# Patient Record
Sex: Female | Born: 1970 | Race: White | Hispanic: No | Marital: Married | Smoking: Never smoker
Health system: Southern US, Community
[De-identification: ages and names within clinical notes are randomized; demographics above are authoritative.]

## PROBLEM LIST (undated history)

## (undated) DIAGNOSIS — F419 Anxiety disorder, unspecified: Secondary | ICD-10-CM

## (undated) DIAGNOSIS — K219 Gastro-esophageal reflux disease without esophagitis: Secondary | ICD-10-CM

## (undated) DIAGNOSIS — Z9889 Other specified postprocedural states: Secondary | ICD-10-CM

## (undated) DIAGNOSIS — IMO0002 Reserved for concepts with insufficient information to code with codable children: Secondary | ICD-10-CM

## (undated) DIAGNOSIS — E785 Hyperlipidemia, unspecified: Secondary | ICD-10-CM

## (undated) DIAGNOSIS — D649 Anemia, unspecified: Secondary | ICD-10-CM

## (undated) DIAGNOSIS — F32A Depression, unspecified: Secondary | ICD-10-CM

## (undated) DIAGNOSIS — J302 Other seasonal allergic rhinitis: Secondary | ICD-10-CM

## (undated) DIAGNOSIS — R112 Nausea with vomiting, unspecified: Secondary | ICD-10-CM

## (undated) HISTORY — PX: ABDOMINAL HYSTERECTOMY: SHX81

## (undated) HISTORY — PX: ROTATOR CUFF REPAIR: SHX139

## (undated) HISTORY — PX: TOTAL HIP ARTHROPLASTY: SHX124

---
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
## 1998-03-13 ENCOUNTER — Encounter: Admission: RE | Admit: 1998-03-13 | Discharge: 1998-03-13 | Payer: Self-pay | Admitting: Family Medicine

## 1998-04-12 ENCOUNTER — Encounter: Admission: RE | Admit: 1998-04-12 | Discharge: 1998-04-12 | Payer: Self-pay | Admitting: Family Medicine

## 2001-03-18 ENCOUNTER — Ambulatory Visit (HOSPITAL_COMMUNITY): Admission: RE | Admit: 2001-03-18 | Discharge: 2001-03-18 | Payer: Self-pay | Admitting: Family Medicine

## 2001-03-18 ENCOUNTER — Encounter: Payer: Self-pay | Admitting: Family Medicine

## 2004-05-08 ENCOUNTER — Other Ambulatory Visit: Admission: RE | Admit: 2004-05-08 | Discharge: 2004-05-08 | Payer: Self-pay | Admitting: Family Medicine

## 2005-06-15 ENCOUNTER — Ambulatory Visit (HOSPITAL_COMMUNITY): Admission: RE | Admit: 2005-06-15 | Discharge: 2005-06-15 | Payer: Self-pay | Admitting: Orthopedic Surgery

## 2006-02-19 ENCOUNTER — Emergency Department (HOSPITAL_COMMUNITY): Admission: EM | Admit: 2006-02-19 | Discharge: 2006-02-19 | Payer: Self-pay | Admitting: Family Medicine

## 2006-12-18 ENCOUNTER — Ambulatory Visit (HOSPITAL_COMMUNITY): Admission: RE | Admit: 2006-12-18 | Discharge: 2006-12-18 | Payer: Self-pay | Admitting: Ophthalmology

## 2006-12-18 ENCOUNTER — Encounter (INDEPENDENT_AMBULATORY_CARE_PROVIDER_SITE_OTHER): Payer: Self-pay | Admitting: Specialist

## 2009-08-02 ENCOUNTER — Observation Stay (HOSPITAL_COMMUNITY): Admission: AD | Admit: 2009-08-02 | Discharge: 2009-08-03 | Payer: Self-pay | Admitting: Obstetrics and Gynecology

## 2009-08-02 ENCOUNTER — Encounter: Payer: Self-pay | Admitting: Emergency Medicine

## 2009-10-02 ENCOUNTER — Inpatient Hospital Stay (HOSPITAL_COMMUNITY): Admission: AD | Admit: 2009-10-02 | Discharge: 2009-10-06 | Payer: Self-pay | Admitting: Obstetrics and Gynecology

## 2009-10-04 ENCOUNTER — Encounter (INDEPENDENT_AMBULATORY_CARE_PROVIDER_SITE_OTHER): Payer: Self-pay | Admitting: Obstetrics and Gynecology

## 2009-10-07 ENCOUNTER — Inpatient Hospital Stay (HOSPITAL_COMMUNITY): Admission: AD | Admit: 2009-10-07 | Discharge: 2009-10-07 | Payer: Self-pay | Admitting: Obstetrics and Gynecology

## 2009-10-19 ENCOUNTER — Inpatient Hospital Stay (HOSPITAL_COMMUNITY): Admission: AD | Admit: 2009-10-19 | Discharge: 2009-10-19 | Payer: Self-pay | Admitting: Obstetrics and Gynecology

## 2010-11-07 LAB — CBC
Hemoglobin: 10.6 g/dL — ABNORMAL LOW (ref 12.0–15.0)
Hemoglobin: 6.5 g/dL — CL (ref 12.0–15.0)
MCHC: 34.2 g/dL (ref 30.0–36.0)
MCV: 99.5 fL (ref 78.0–100.0)
Platelets: 198 10*3/uL (ref 150–400)
RBC: 1.88 MIL/uL — ABNORMAL LOW (ref 3.87–5.11)
RBC: 3.1 MIL/uL — ABNORMAL LOW (ref 3.87–5.11)
RDW: 12.9 % (ref 11.5–15.5)
RDW: 13.1 % (ref 11.5–15.5)
RDW: 13.3 % (ref 11.5–15.5)
WBC: 10.7 10*3/uL — ABNORMAL HIGH (ref 4.0–10.5)
WBC: 7.3 10*3/uL (ref 4.0–10.5)

## 2010-11-07 LAB — TYPE AND SCREEN
ABO/RH(D): O POS
Antibody Screen: NEGATIVE

## 2010-11-07 LAB — COMPREHENSIVE METABOLIC PANEL
Albumin: 3.1 g/dL — ABNORMAL LOW (ref 3.5–5.2)
BUN: 4 mg/dL — ABNORMAL LOW (ref 6–23)
Calcium: 8.4 mg/dL (ref 8.4–10.5)
Glucose, Bld: 99 mg/dL (ref 70–99)
Total Protein: 5.4 g/dL — ABNORMAL LOW (ref 6.0–8.3)

## 2010-11-07 LAB — ABO/RH: ABO/RH(D): O POS

## 2010-11-11 LAB — WOUND CULTURE

## 2010-11-19 LAB — CBC
HCT: 28.5 % — ABNORMAL LOW (ref 36.0–46.0)
HCT: 29.2 % — ABNORMAL LOW (ref 36.0–46.0)
Hemoglobin: 9.6 g/dL — ABNORMAL LOW (ref 12.0–15.0)
MCHC: 33.8 g/dL (ref 30.0–36.0)
MCV: 101.2 fL — ABNORMAL HIGH (ref 78.0–100.0)
Platelets: 192 10*3/uL (ref 150–400)
Platelets: 210 10*3/uL (ref 150–400)
RBC: 2.81 MIL/uL — ABNORMAL LOW (ref 3.87–5.11)
RBC: 2.91 MIL/uL — ABNORMAL LOW (ref 3.87–5.11)
RDW: 13.7 % (ref 11.5–15.5)
WBC: 5.8 10*3/uL (ref 4.0–10.5)
WBC: 5.9 10*3/uL (ref 4.0–10.5)

## 2010-11-20 LAB — URINALYSIS, ROUTINE W REFLEX MICROSCOPIC
Glucose, UA: NEGATIVE mg/dL
Hgb urine dipstick: NEGATIVE
Protein, ur: NEGATIVE mg/dL
Specific Gravity, Urine: 1.008 (ref 1.005–1.030)

## 2010-11-20 LAB — DIFFERENTIAL
Basophils Absolute: 0 10*3/uL (ref 0.0–0.1)
Eosinophils Relative: 3 % (ref 0–5)
Lymphocytes Relative: 12 % (ref 12–46)
Neutro Abs: 7 10*3/uL (ref 1.7–7.7)

## 2010-11-20 LAB — CBC
HCT: 30.3 % — ABNORMAL LOW (ref 36.0–46.0)
HCT: 37 % (ref 36.0–46.0)
Hemoglobin: 10.4 g/dL — ABNORMAL LOW (ref 12.0–15.0)
MCV: 99.1 fL (ref 78.0–100.0)
RBC: 3.74 MIL/uL — ABNORMAL LOW (ref 3.87–5.11)
RDW: 13.3 % (ref 11.5–15.5)
WBC: 8.9 10*3/uL (ref 4.0–10.5)

## 2010-11-20 LAB — D-DIMER, QUANTITATIVE

## 2010-11-20 LAB — CROSSMATCH: ABO/RH(D): O POS

## 2010-11-20 LAB — COMPREHENSIVE METABOLIC PANEL
CO2: 24 mEq/L (ref 19–32)
Calcium: 9 mg/dL (ref 8.4–10.5)
Creatinine, Ser: 0.7 mg/dL (ref 0.4–1.2)
GFR calc non Af Amer: >60 mL/min (ref >60)
Glucose, Bld: 105 mg/dL — ABNORMAL HIGH (ref 70–99)

## 2010-11-20 LAB — POCT CARDIAC MARKERS: Myoglobin, poc: 65.1 ng/mL (ref 12–200)

## 2010-11-20 LAB — HCG, QUANTITATIVE, PREGNANCY

## 2010-11-20 LAB — ABO/RH: ABO/RH(D): O POS

## 2011-01-04 NOTE — H&P (Signed)
[name], [name]           ACCOUNT NO.:  [id number]   MEDICAL RECORD NO.:  [id number]          PATIENT TYPE:  AMB   LOCATION:  SDC                           FACILITY:  [facility]   PHYSICIAN:  Duke Salvia. Marcelle Overlie, M.D.DATE OF BIRTH:  1970/11/11   DATE OF ADMISSION:  12/18/2006  DATE OF DISCHARGE:                              HISTORY & PHYSICAL   CHIEF COMPLAINT:  Menorrhagia.   HISTORY OF PRESENT ILLNESS:  A 40 year old G0, P0.  The patient has a 6-  12 month history of menorrhagia.  A recent Pap test showed ASCUS, with  negative high-risk HPV; SHG in our office showed a 4.9 cm subserosal  fibroid on the right lateral wall with some degeneration, and a 4 mm  polyp  noted on saline infusion.  This patient is gay and assures she  would not to be pregnant in the future.  She has opted for a D&C  hysteroscopy with ThermaChoice EMA.   This procedure, including the risks of bleeding, infection, adjacent  organ injury, the need for further surgery are reviewed with her; along  with her expected recovery time.  The rates of hypo- and amenorrhea post  ablation were reviewed with her; also which she understands and accepts.   PAST MEDICAL HISTORY:   ALLERGIES:  SULFA.   CURRENT MEDICATIONS:  Iron and vitamin supplement.   FAMILY HISTORY:  Significant for her father with hypothyroidism.  Her  father also has hypertension.   SOCIAL HISTORY:  She does smoke 1/2 - 1 pack per day.   PHYSICAL EXAMINATION:  VITAL SIGNS:  Temperature 98.2, blood pressure  110/68.  HEENT:  Unremarkable.  NECK:  Supple without masses.  LUNGS:  Clear.  CARDIOVASCULAR:  Regular rate and rhythm without murmurs, rubs or  gallops.  BREASTS:  Without masses.  ABDOMEN:  Soft, flat, nontender.  PELVIC:  Normal external genitalia.  Vagina and cervix clear.  Uterus  mid-positional size.  Adnexa negative.  EXTREMITIES:  Unremarkable.  NEUROLOGIC:  Unremarkable.   IMPRESSION:  1. Endometrial polyp.  2.  Menorrhagia with mild anemia.   PLAN:  D&C hysteroscopy, ThermaChoice EMA.  The procedures and risks  discussed, as above.  She understands that pregnancy would not be an  option for her post-ablation.  Other risks are discussed as above.      Richard M. Marcelle Overlie, M.D.  Electronically Signed     [facility]/MEDQ  D:  12/11/2006  T:  12/11/2006  Job:  [id number]

## 2011-01-04 NOTE — Op Note (Signed)
[name], [name]           ACCOUNT NO.:  [id number]   MEDICAL RECORD NO.:  [id number]          PATIENT TYPE:  AMB   LOCATION:  SDC                           FACILITY:  [facility]   PHYSICIAN:  Duke Salvia. Marcelle Sosa, M.D.DATE OF BIRTH:  [birth year]   DATE OF PROCEDURE:  12/18/2006  DATE OF DISCHARGE:                               OPERATIVE REPORT   PREOPERATIVE DIAGNOSES:  1. Menorrhagia.  2. Leiomyoma.   POSTOPERATIVE DIAGNOSES:  1. Menorrhagia.  2. Leiomyoma.   PROCEDURE:  Dilatation and curettage, hysteroscopy, with ThermaChoice  endometrial ablation.   SURGEON:  Duke Salvia. Marcelle Sosa, M.D.   ANESTHESIA:  General.   COMPLICATIONS:  None.   ESTIMATED BLOOD LOSS:  Minimal.   SPECIMENS REMOVED:  Endometrial curettings.   PROCEDURE AND FINDINGS:  The patient was taken to the operating room and  after an adequate level of general anesthesia was obtained with the  patient's legs in stirrups, the perineum and vagina were prepped and  draped with Betadine and the bladder was drained, EUA carried out.  Uterus upper limit of normal size, midposition, adnexa negative.  A  speculum was positioned.  Cervix grasped with tenaculum.  Paracervical  block created by infiltrating at 3 and 9 o'clock submucosally with 5-7  mL 1% Xylocaine on either side after negative aspiration.  The uterus  was sounded to 8 cm, was progressively dilated to a 27 Pratt dilator.  The 7-mm continuous flow hysteroscope was then used to perform  hysteroscopy, revealing a moderate amount of shaggy endometrium,  limiting the initial view.  Sharp curettage was carried out and a  moderate amount of tissue was sent to pathology.  The scope was  reinserted and the cavity was irrigated.  No other abnormalities  remained.  The ThermaChoice balloon was inserted per protocol and an 8-  minute procedure carried out per usual protocol with no immediate  complications.  She did receive preop Toradol along with preop  antibiotics.  She went to recovery room in good condition.      [name], M.D.  Electronically Signed    [facility]/MEDQ  D:  12/18/2006  T:  12/18/2006  Job:  [id number]

## 2011-11-25 ENCOUNTER — Other Ambulatory Visit: Payer: Self-pay | Admitting: Obstetrics and Gynecology

## 2012-03-17 ENCOUNTER — Encounter: Payer: Self-pay | Admitting: Gastroenterology

## 2012-03-19 ENCOUNTER — Other Ambulatory Visit: Payer: Self-pay | Admitting: Internal Medicine

## 2012-03-19 DIAGNOSIS — K219 Gastro-esophageal reflux disease without esophagitis: Secondary | ICD-10-CM

## 2012-03-20 ENCOUNTER — Ambulatory Visit
Admission: RE | Admit: 2012-03-20 | Discharge: 2012-03-20 | Disposition: A | Discharge status: Home / Self Care | Payer: BC Managed Care – PPO | Source: Ambulatory Visit | Attending: Internal Medicine | Admitting: Internal Medicine

## 2012-03-20 DIAGNOSIS — K219 Gastro-esophageal reflux disease without esophagitis: Secondary | ICD-10-CM

## 2012-03-21 ENCOUNTER — Emergency Department (HOSPITAL_COMMUNITY)
Admission: EM | Admit: 2012-03-21 | Discharge: 2012-03-21 | Disposition: A | Discharge status: Home / Self Care | Payer: BC Managed Care – PPO | Attending: Emergency Medicine | Admitting: Emergency Medicine

## 2012-03-21 ENCOUNTER — Encounter (HOSPITAL_COMMUNITY): Payer: Self-pay | Admitting: *Deleted

## 2012-03-21 ENCOUNTER — Emergency Department (HOSPITAL_COMMUNITY): Payer: BC Managed Care – PPO

## 2012-03-21 DIAGNOSIS — K6289 Other specified diseases of anus and rectum: Secondary | ICD-10-CM | POA: Insufficient documentation

## 2012-03-21 DIAGNOSIS — F411 Generalized anxiety disorder: Secondary | ICD-10-CM | POA: Insufficient documentation

## 2012-03-21 DIAGNOSIS — K5900 Constipation, unspecified: Secondary | ICD-10-CM

## 2012-03-21 HISTORY — DX: Reserved for concepts with insufficient information to code with codable children: IMO0002

## 2012-03-21 LAB — CBC WITH DIFFERENTIAL/PLATELET
Basophils Absolute: 0 10*3/uL (ref 0.0–0.1)
Basophils Relative: 0 % (ref 0–1)
Eosinophils Absolute: 0.3 10*3/uL (ref 0.0–0.7)
Eosinophils Relative: 6 % — ABNORMAL HIGH (ref 0–5)
HCT: 39 % (ref 36.0–46.0)
Lymphocytes Relative: 32 % (ref 12–46)
MCH: 32.3 pg (ref 26.0–34.0)
MCHC: 36.2 g/dL — ABNORMAL HIGH (ref 30.0–36.0)
MCV: 89.2 fL (ref 78.0–100.0)
Monocytes Absolute: 0.4 10*3/uL (ref 0.1–1.0)
Platelets: 263 10*3/uL (ref 150–400)
RDW: 12.1 % (ref 11.5–15.5)

## 2012-03-21 LAB — URINALYSIS, ROUTINE W REFLEX MICROSCOPIC
Glucose, UA: NEGATIVE mg/dL
Hgb urine dipstick: NEGATIVE
Ketones, ur: NEGATIVE mg/dL
Protein, ur: NEGATIVE mg/dL
Urobilinogen, UA: 0.2 mg/dL (ref 0.0–1.0)

## 2012-03-21 LAB — COMPREHENSIVE METABOLIC PANEL
AST: 29 U/L (ref 0–37)
Albumin: 4.4 g/dL (ref 3.5–5.2)
Alkaline Phosphatase: 53 U/L (ref 39–117)
BUN: 10 mg/dL (ref 6–23)
CO2: 27 mEq/L (ref 19–32)
Chloride: 99 mEq/L (ref 96–112)
GFR calc non Af Amer: >90 mL/min (ref >90)
Potassium: 4.3 mEq/L (ref 3.5–5.1)
Total Bilirubin: 0.5 mg/dL (ref 0.3–1.2)

## 2012-03-21 LAB — OCCULT BLOOD, POC DEVICE: Fecal Occult Bld: NEGATIVE

## 2012-03-21 MED ORDER — SODIUM CHLORIDE 0.9 % IV SOLN
1000.0000 mL | Freq: Once | INTRAVENOUS | Status: AC
  Administered 2012-03-21: 1000 mL via INTRAVENOUS

## 2012-03-21 MED ORDER — SODIUM CHLORIDE 0.9 % IV SOLN: 1000.0000 mL | INTRAVENOUS | Status: DC

## 2012-03-21 MED ORDER — PSYLLIUM 28 % PO PACK: 1.0000 | PACK | Freq: Two times a day (BID) | ORAL | Status: DC

## 2012-03-21 MED ORDER — ONDANSETRON HCL 4 MG/2ML IJ SOLN
4.0000 mg | Freq: Once | INTRAMUSCULAR | Status: AC
  Administered 2012-03-21: 4 mg via INTRAVENOUS
  Filled 2012-03-21: qty 2

## 2012-03-21 MED ORDER — IOHEXOL 300 MG/ML  SOLN
100.0000 mL | Freq: Once | INTRAMUSCULAR | Status: AC | PRN
  Administered 2012-03-21: 100 mL via INTRAVENOUS

## 2012-03-21 MED ORDER — DOCUSATE SODIUM 100 MG PO CAPS: 100.0000 mg | ORAL_CAPSULE | Freq: Two times a day (BID) | ORAL | Status: AC

## 2012-03-21 MED ORDER — ESOMEPRAZOLE MAGNESIUM 40 MG PO CPDR: 40.0000 mg | DELAYED_RELEASE_CAPSULE | Freq: Every day | ORAL | Status: DC

## 2012-03-21 MED ORDER — PROMETHAZINE HCL 25 MG RE SUPP: 25.0000 mg | Freq: Four times a day (QID) | RECTAL | Status: DC | PRN

## 2012-03-21 MED ORDER — HYDROMORPHONE HCL PF 1 MG/ML IJ SOLN
1.0000 mg | Freq: Once | INTRAMUSCULAR | Status: AC
  Administered 2012-03-21: 1 mg via INTRAVENOUS
  Filled 2012-03-21: qty 1

## 2012-03-21 NOTE — ED Provider Notes (Addendum)
History     CSN: [id number]  Arrival date & time 03/21/12  1235   First MD Initiated Contact with Patient 03/21/12 1321      Chief Complaint  Patient presents with  . Rectal Pain     HPI Pt is having rectal pain ongoing for the last day.  She has been having trouble with pelvic pain and constipation for several months. SHe Pt has been taking miralax without relief.  Pt called her doctor this week and was referred to a GI doctor. Pt had a gallbladder ultrasound this past week that was unremarkable.  Pt feels a lot of pressure in the rectum and feels like she is obstructed.  Her bowel movements are typically very small in caliber, and hard.  She has to strain very heard.  Her last BM was this am after an enema (watery).  She has been passing hard stools.  No fevers.  No weakness in legs. She has chronic back pain but this is no worse than usual.   Some dizziness.  She feels like something is narrowed. Pt feels pressure in the pelvic region.  Pt is frustrated because she does not feel like her doctor is paying attention to her.  Pt is a [employer].  She has had a pelvic exam as part of her prior outpt eval as well as having her thyroid hormone checked.   Past Medical History  Diagnosis Date  . Degenerated intervertebral disc     Past Surgical History  Procedure Date  . Abdominal hysterectomy   including ovaries  No family history on file.  History  Substance Use Topics  . Smoking status: Never Smoker   . Smokeless tobacco: Not on file  . Alcohol Use: Yes    OB History    Grav Para Term Preterm Abortions TAB SAB Ect Mult Living                  Review of Systems  Constitutional: Negative for fever.  Gastrointestinal: Negative for vomiting and blood in stool.  Genitourinary: Negative for dysuria.  All other systems reviewed and are negative.    Allergies  Sulfa antibiotics  Home Medications   Current Outpatient Rx  Name Route Sig Dispense Refill  . CETIRIZINE HCL 10 MG PO  TABS Oral Take 10 mg by mouth daily.    Marland Kitchen VITAMIN D 1000 UNITS PO TABS Oral Take 2,000 Units by mouth daily.    Marland Kitchen ESTRADIOL 0.0375 MG/24HR TD PTTW Transdermal Place 1 patch onto the skin 2 (two) times a week. Mondays and thursdays.    . OMEGA-3 FATTY ACIDS 1000 MG PO CAPS Oral Take 2 g by mouth daily.    . IBUPROFEN 200 MG PO TABS Oral Take 600 mg by mouth every 8 (eight) hours as needed. For pain.    . ADULT MULTIVITAMIN W/MINERALS CH Oral Take 1 tablet by mouth daily.    . TRAMADOL HCL 50 MG PO TABS Oral Take 50 mg by mouth at bedtime.      BP 124/92  Pulse 62  Temp 98.1 F (36.7 C) (Oral)  Resp 18  SpO2 100%  Physical Exam  Nursing note and vitals reviewed. Constitutional: She appears well-developed and well-nourished.       Tearful, anxious  HENT:  Head: Normocephalic and atraumatic.  Right Ear: External ear normal.  Left Ear: External ear normal.  Eyes: Conjunctivae are normal. Right eye exhibits no discharge. Left eye exhibits no discharge. No scleral icterus.  Neck: Neck supple. No tracheal deviation present.  Cardiovascular: Normal rate, regular rhythm and intact distal pulses.   Pulmonary/Chest: Effort normal and breath sounds normal. No stridor. No respiratory distress. She has no wheezes. She has no rales.  Abdominal: Soft. Bowel sounds are normal. She exhibits no distension. There is no tenderness. There is no rebound and no guarding.  Genitourinary: Rectal exam shows no mass and anal tone normal.       No fecal impaction, rectal vault empty  Musculoskeletal: She exhibits no edema and no tenderness.  Neurological: She is alert. She has normal strength. No sensory deficit. Cranial nerve deficit:  no gross defecits noted. She exhibits normal muscle tone. She displays no seizure activity. Coordination normal.  Skin: Skin is warm and dry. No rash noted. She is not diaphoretic.  Psychiatric: She has a normal mood and affect.    ED Course  Procedures (including critical  care time)  Labs Reviewed  CBC WITH DIFFERENTIAL - Abnormal; Notable for the following:    MCHC 36.2 (*)     Eosinophils Relative 6 (*)     All other components within normal limits  OCCULT BLOOD, POC DEVICE  COMPREHENSIVE METABOLIC PANEL  URINALYSIS, ROUTINE W REFLEX MICROSCOPIC   Ct Abdomen Pelvis W Contrast  03/21/2012  *RADIOLOGY REPORT*  Clinical Data: Rectal pain  CT ABDOMEN AND PELVIS WITH CONTRAST  Technique:  Multidetector CT imaging of the abdomen and pelvis was performed following the standard protocol during bolus administration of intravenous contrast.  Contrast: OMNIPAQUE IOHEXOL 300 MG/ML  SOLN  Comparison: 09/22/2009  Findings: The lung bases appear clear.  No pericardial or pleural effusion noted.  Enhancing structure within the right hepatic lobe is unchanged from previous exam dating back to 08/02/2009.  Likely benign.  The gallbladder appears normal.  No biliary dilatation.  The pancreas appears within normal limits.  Normal appearance of the spleen.  Both adrenal glands are normal.  The right kidney is normal.  The left kidney is normal.  Urinary bladder is unremarkable.  Previous hysterectomy.  No enlarged lymph nodes within the upper abdomen.  There is no pelvic or inguinal adenopathy identified.  No free fluid or fluid collections within the abdomen or the pelvis.  Normal appearance of the stomach.  The small bowel loops are unremarkable.  The appendix is identified.  There is gas noted within the lumen of the appendix consistent with a luminal patency. No periappendiceal fat stranding or free fluid noted.  A moderate amount of stool is identified throughout the colon.  No obstructing colonic lesion identified.  IMPRESSION:  1.  No acute findings. 2.  Moderate stool burden identified within the colon.  Correlate for any clinical signs or symptoms of constipation.  Original Report Authenticated By: Rosealee Albee, M.D.   US Abdomen Limited Ruq  03/20/2012  *RADIOLOGY  REPORT*  Clinical Data: Gastroesophageal reflux disease.  Abdominal pain and nausea  LIMITED ABDOMINAL ULTRASOUND  Comparison:  CT 10/02/2009  Findings: The gallbladder appears well distended.  No evidence for intraluminal stones or sludge are noted.  No pericholecystic fluid or gallbladder wall thickening is identified. Evaluation for a sonographic Murphy's sign is negative.  The common bile duct measures 2.1 mm in diameter and has a normal appearance.  The liver parenchyma appears homogeneous in echotexture with no evidence for focal parenchymal abnormality or signs of intrahepatic ductal dilatation.  The 8 mm lesion seen on prior CT in the anterior right lobe of the liver was  not visualized on today's ultrasound and if evaluation for stability of this lesion is desired, repeat abdominal CT would be necessary.  No right upper quadrant fluid is seen  IMPRESSION: No focal abnormality identified  Original Report Authenticated By: Bertha Stakes, M.D.      MDM  Pt is concerned about the severity of her symptoms and progression.  She has an appt set up with a GI doctor but it is later this month.  Will ct to evaluate for possible obstructing mass causing her symptoms.    Celene Kras, MD 03/21/12 1445  Ct scan without mass or other acute pathology.  Will treat her constipation.  Follow up with GI and PCP as planned  Celene Kras, MD 03/21/12 1538

## 2012-03-21 NOTE — ED Notes (Signed)
Pt states she is having rectal pain. Pt state her pcp is not addressing the her pain. Pt states she feel as if she is constipated. Pt states she gave her self an enema and small amount of stool came out. Pt states she feels pressure from her rectal area. No bleeding noted

## 2012-04-14 ENCOUNTER — Ambulatory Visit: Payer: Self-pay | Admitting: Gastroenterology

## 2012-11-26 ENCOUNTER — Other Ambulatory Visit: Payer: Self-pay | Admitting: Obstetrics and Gynecology

## 2013-01-04 ENCOUNTER — Encounter (HOSPITAL_COMMUNITY): Payer: Self-pay | Admitting: Certified Registered Nurse Anesthetist

## 2013-01-04 ENCOUNTER — Observation Stay (HOSPITAL_COMMUNITY)
Admission: EM | Admit: 2013-01-04 | Discharge: 2013-01-05 | Disposition: A | Discharge status: Home / Self Care | Payer: BC Managed Care – PPO | Attending: Surgery | Admitting: Surgery

## 2013-01-04 ENCOUNTER — Other Ambulatory Visit (HOSPITAL_COMMUNITY): Payer: Self-pay | Admitting: Internal Medicine

## 2013-01-04 ENCOUNTER — Encounter (HOSPITAL_COMMUNITY): Payer: Self-pay

## 2013-01-04 ENCOUNTER — Encounter (HOSPITAL_COMMUNITY)
Admission: EM | Disposition: A | Discharge status: Home / Self Care | Payer: Self-pay | Source: Home / Self Care | Attending: Emergency Medicine

## 2013-01-04 ENCOUNTER — Encounter (HOSPITAL_COMMUNITY): Payer: Self-pay | Admitting: Emergency Medicine

## 2013-01-04 ENCOUNTER — Ambulatory Visit (HOSPITAL_COMMUNITY)
Admission: RE | Admit: 2013-01-04 | Discharge: 2013-01-04 | Disposition: A | Discharge status: Home / Self Care | Payer: BC Managed Care – PPO | Source: Ambulatory Visit | Attending: Internal Medicine | Admitting: Internal Medicine

## 2013-01-04 ENCOUNTER — Emergency Department (HOSPITAL_COMMUNITY): Payer: BC Managed Care – PPO | Admitting: Certified Registered Nurse Anesthetist

## 2013-01-04 DIAGNOSIS — R109 Unspecified abdominal pain: Secondary | ICD-10-CM

## 2013-01-04 DIAGNOSIS — K3580 Unspecified acute appendicitis: Secondary | ICD-10-CM | POA: Insufficient documentation

## 2013-01-04 DIAGNOSIS — D1809 Hemangioma of other sites: Secondary | ICD-10-CM | POA: Insufficient documentation

## 2013-01-04 DIAGNOSIS — M47817 Spondylosis without myelopathy or radiculopathy, lumbosacral region: Secondary | ICD-10-CM | POA: Insufficient documentation

## 2013-01-04 DIAGNOSIS — R1031 Right lower quadrant pain: Secondary | ICD-10-CM | POA: Insufficient documentation

## 2013-01-04 DIAGNOSIS — K5900 Constipation, unspecified: Secondary | ICD-10-CM | POA: Insufficient documentation

## 2013-01-04 DIAGNOSIS — Z90710 Acquired absence of both cervix and uterus: Secondary | ICD-10-CM | POA: Insufficient documentation

## 2013-01-04 DIAGNOSIS — K352 Acute appendicitis with generalized peritonitis, without abscess: Secondary | ICD-10-CM | POA: Diagnosis present

## 2013-01-04 DIAGNOSIS — R110 Nausea: Secondary | ICD-10-CM | POA: Insufficient documentation

## 2013-01-04 DIAGNOSIS — K35209 Acute appendicitis with generalized peritonitis, without abscess, unspecified as to perforation: Principal | ICD-10-CM | POA: Diagnosis present

## 2013-01-04 DIAGNOSIS — Z9049 Acquired absence of other specified parts of digestive tract: Secondary | ICD-10-CM

## 2013-01-04 DIAGNOSIS — K769 Liver disease, unspecified: Secondary | ICD-10-CM | POA: Insufficient documentation

## 2013-01-04 HISTORY — PX: LAPAROSCOPIC APPENDECTOMY: SHX408

## 2013-01-04 LAB — URINALYSIS, ROUTINE W REFLEX MICROSCOPIC
Bilirubin Urine: NEGATIVE
Glucose, UA: NEGATIVE mg/dL
Hgb urine dipstick: NEGATIVE
Ketones, ur: NEGATIVE mg/dL
Leukocytes, UA: NEGATIVE
Nitrite: NEGATIVE
Protein, ur: NEGATIVE mg/dL
Specific Gravity, Urine: 1.028 (ref 1.005–1.030)
Urobilinogen, UA: 0.2 mg/dL (ref 0.0–1.0)
pH: 6 (ref 5.0–8.0)

## 2013-01-04 LAB — BASIC METABOLIC PANEL
BUN: 7 mg/dL (ref 6–23)
CO2: 27 mEq/L (ref 19–32)
Calcium: 9.1 mg/dL (ref 8.4–10.5)
Chloride: 100 mEq/L (ref 96–112)
Creatinine, Ser: 0.71 mg/dL (ref 0.50–1.10)
GFR calc Af Amer: >90 mL/min (ref >90)
GFR calc non Af Amer: >90 mL/min (ref >90)
Glucose, Bld: 89 mg/dL (ref 70–99)
Potassium: 4.6 mEq/L (ref 3.5–5.1)
Sodium: 137 mEq/L (ref 135–145)

## 2013-01-04 LAB — CBC WITH DIFFERENTIAL/PLATELET
Basophils Absolute: 0 10*3/uL (ref 0.0–0.1)
Basophils Relative: 1 % (ref 0–1)
Eosinophils Absolute: 0.4 10*3/uL (ref 0.0–0.7)
Eosinophils Relative: 7 % — ABNORMAL HIGH (ref 0–5)
HCT: 35 % — ABNORMAL LOW (ref 36.0–46.0)
Hemoglobin: 11.8 g/dL — ABNORMAL LOW (ref 12.0–15.0)
Lymphocytes Relative: 37 % (ref 12–46)
Lymphs Abs: 2 10*3/uL (ref 0.7–4.0)
MCH: 31.6 pg (ref 26.0–34.0)
MCHC: 33.7 g/dL (ref 30.0–36.0)
MCV: 93.6 fL (ref 78.0–100.0)
Monocytes Absolute: 0.5 10*3/uL (ref 0.1–1.0)
Monocytes Relative: 10 % (ref 3–12)
Neutro Abs: 2.4 10*3/uL (ref 1.7–7.7)
Neutrophils Relative %: 45 % (ref 43–77)
Platelets: 310 10*3/uL (ref 150–400)
RBC: 3.74 MIL/uL — ABNORMAL LOW (ref 3.87–5.11)
RDW: 13.5 % (ref 11.5–15.5)
WBC: 5.3 10*3/uL (ref 4.0–10.5)

## 2013-01-04 SURGERY — APPENDECTOMY, LAPAROSCOPIC
Anesthesia: General | Site: Abdomen | Wound class: Dirty or Infected

## 2013-01-04 MED ORDER — IOHEXOL 300 MG/ML  SOLN
50.0000 mL | Freq: Once | INTRAMUSCULAR | Status: AC | PRN
  Administered 2013-01-04: 50 mL via ORAL

## 2013-01-04 MED ORDER — LACTATED RINGERS IR SOLN
Status: DC | PRN
  Administered 2013-01-04: 1000 mL

## 2013-01-04 MED ORDER — SCOPOLAMINE 1 MG/3DAYS TD PT72
MEDICATED_PATCH | TRANSDERMAL | Status: AC
  Filled 2013-01-04: qty 1

## 2013-01-04 MED ORDER — ACETAMINOPHEN 10 MG/ML IV SOLN
INTRAVENOUS | Status: AC
  Filled 2013-01-04: qty 100

## 2013-01-04 MED ORDER — CEFOXITIN SODIUM-DEXTROSE 1-4 GM-% IV SOLR (PREMIX)
INTRAVENOUS | Status: AC
  Filled 2013-01-04: qty 50

## 2013-01-04 MED ORDER — BUPIVACAINE HCL (PF) 0.25 % IJ SOLN
INTRAMUSCULAR | Status: AC
  Filled 2013-01-04: qty 30

## 2013-01-04 MED ORDER — 0.9 % SODIUM CHLORIDE (POUR BTL) OPTIME
TOPICAL | Status: DC | PRN
  Administered 2013-01-04: 1000 mL

## 2013-01-04 MED ORDER — IOHEXOL 300 MG/ML  SOLN
100.0000 mL | Freq: Once | INTRAMUSCULAR | Status: AC | PRN
  Administered 2013-01-04: 100 mL via INTRAVENOUS

## 2013-01-04 SURGICAL SUPPLY — 46 items
ADH SKN CLS APL DERMABOND .7 (GAUZE/BANDAGES/DRESSINGS) ×1
APL SKNCLS STERI-STRIP NONHPOA (GAUZE/BANDAGES/DRESSINGS)
APPLIER CLIP ROT 10 11.4 M/L (STAPLE)
APR CLP MED LRG 11.4X10 (STAPLE)
BAG SPEC RTRVL LRG 6X4 10 (ENDOMECHANICALS) ×1
BENZOIN TINCTURE PRP APPL 2/3 (GAUZE/BANDAGES/DRESSINGS) ×1 IMPLANT
CANISTER SUCTION 2500CC (MISCELLANEOUS) ×2 IMPLANT
CLIP APPLIE ROT 10 11.4 M/L (STAPLE) IMPLANT
CLOTH BEACON ORANGE TIMEOUT ST (SAFETY) ×2 IMPLANT
CUTTER FLEX LINEAR 45M (STAPLE) ×1 IMPLANT
DECANTER SPIKE VIAL GLASS SM (MISCELLANEOUS) ×1 IMPLANT
DERMABOND ADVANCED (GAUZE/BANDAGES/DRESSINGS) ×1
DERMABOND ADVANCED .7 DNX12 (GAUZE/BANDAGES/DRESSINGS) IMPLANT
DEVICE TROCAR PUNCTURE CLOSURE (ENDOMECHANICALS) ×1 IMPLANT
DRAPE LAPAROSCOPIC ABDOMINAL (DRAPES) ×2 IMPLANT
ELECT REM PT RETURN 9FT ADLT (ELECTROSURGICAL) ×2
ELECTRODE REM PT RTRN 9FT ADLT (ELECTROSURGICAL) ×1 IMPLANT
ENDOLOOP SUT PDS II  0 18 (SUTURE)
ENDOLOOP SUT PDS II 0 18 (SUTURE) IMPLANT
GLOVE BIOGEL PI IND STRL 7.0 (GLOVE) ×1 IMPLANT
GLOVE BIOGEL PI INDICATOR 7.0 (GLOVE) ×1
GLOVE SURG SIGNA 7.5 PF LTX (GLOVE) ×2 IMPLANT
GOWN STRL NON-REIN LRG LVL3 (GOWN DISPOSABLE) ×2 IMPLANT
GOWN STRL REIN XL XLG (GOWN DISPOSABLE) ×4 IMPLANT
KIT BASIN OR (CUSTOM PROCEDURE TRAY) ×2 IMPLANT
PENCIL BUTTON HOLSTER BLD 10FT (ELECTRODE) IMPLANT
POUCH SPECIMEN RETRIEVAL 10MM (ENDOMECHANICALS) ×2 IMPLANT
RELOAD 45 VASCULAR/THIN (ENDOMECHANICALS) IMPLANT
RELOAD STAPLE 45 2.5 WHT GRN (ENDOMECHANICALS) IMPLANT
RELOAD STAPLE 45 3.5 BLU ETS (ENDOMECHANICALS) IMPLANT
RELOAD STAPLE TA45 3.5 REG BLU (ENDOMECHANICALS) ×2 IMPLANT
SCALPEL HARMONIC ACE (MISCELLANEOUS) ×2 IMPLANT
SET IRRIG TUBING LAPAROSCOPIC (IRRIGATION / IRRIGATOR) ×2 IMPLANT
SOLUTION ANTI FOG 6CC (MISCELLANEOUS) ×2 IMPLANT
STRIP CLOSURE SKIN 1/4X3 (GAUZE/BANDAGES/DRESSINGS) ×1 IMPLANT
SUT MON AB 5-0 PS2 18 (SUTURE) ×2 IMPLANT
SUT VICRYL 0 UR6 27IN ABS (SUTURE) ×2 IMPLANT
TOWEL OR 17X26 10 PK STRL BLUE (TOWEL DISPOSABLE) ×2 IMPLANT
TRAY FOLEY CATH 14FRSI W/METER (CATHETERS) ×2 IMPLANT
TRAY LAP CHOLE (CUSTOM PROCEDURE TRAY) ×2 IMPLANT
TROCAR BLADELESS OPT 5 100 (ENDOMECHANICALS) ×1 IMPLANT
TROCAR XCEL 12X100 BLDLESS (ENDOMECHANICALS) ×1 IMPLANT
TROCAR XCEL BLUNT TIP 100MML (ENDOMECHANICALS) ×2 IMPLANT
TROCAR Z-THREAD FIOS 11X100 BL (TROCAR) ×2 IMPLANT
TROCAR Z-THREAD FIOS 5X100MM (TROCAR) ×2 IMPLANT
TUBING INSUFFLATION 10FT LAP (TUBING) ×2 IMPLANT

## 2013-01-04 NOTE — ED Notes (Signed)
Pt to OR.

## 2013-01-04 NOTE — ED Notes (Signed)
MD at bedside. 

## 2013-01-04 NOTE — ED Provider Notes (Signed)
History     CSN: [id number]  Arrival date & time 01/04/13  2142   First MD Initiated Contact with Patient 01/04/13 2150      Chief Complaint  Patient presents with  . Abdominal Pain  . appendicitis     (Consider location/radiation/quality/duration/timing/severity/associated sxs/prior treatment) HPI Patient presents to the emergency department with a four-day history of right lower quadrant abdominal pain.  Patient, states she was seen by her primary Dr. who sent her here for CT scan as an outpatient CT scan was done, which showed appendicitis.  Patient, states she's had some nausea, but no vomiting, chills, but no documented fever.  Patient, states, that she's not had any dizziness, weakness, vomiting, headache, blurred vision, syncope, or fever.  Patient, states, that she did not take anything prior to arrival for her symptoms Past Medical History  Diagnosis Date  . Degenerated intervertebral disc     Past Surgical History  Procedure Laterality Date  . Abdominal hysterectomy      No family history on file.  History  Substance Use Topics  . Smoking status: Never Smoker   . Smokeless tobacco: Not on file  . Alcohol Use: Yes    OB History   Grav Para Term Preterm Abortions TAB SAB Ect Mult Living                  Review of Systems All other systems negative except as documented in the HPI. All pertinent positives and negatives as reviewed in the HPI. Allergies  Sulfa antibiotics  Home Medications   Current Outpatient Rx  Name  Route  Sig  Dispense  Refill  . calcium-vitamin D (OSCAL WITH D) 500-200 MG-UNIT per tablet   Oral   Take 1 tablet by mouth daily.         . cetirizine (ZYRTEC) 10 MG tablet   Oral   Take 10 mg by mouth daily.         Marland Kitchen estradiol (VIVELLE-DOT) 0.075 MG/24HR   Transdermal   Place 1 patch onto the skin 2 (two) times a week. On mondays and thursdays         . fish oil-omega-3 fatty acids 1000 MG capsule   Oral   Take 2 g by  mouth daily.         Marland Kitchen ibuprofen (ADVIL,MOTRIN) 200 MG tablet   Oral   Take 600 mg by mouth every 8 (eight) hours as needed. For pain.         . Magnesium Oxide 500 MG CAPS   Oral   Take 1,000 mg by mouth daily.           BP 134/73  Pulse 53  Temp(Src) 98 F (36.7 C) (Oral)  Resp 16  SpO2 100%  Physical Exam  Nursing note and vitals reviewed. Constitutional: She is oriented to person, place, and time. She appears well-developed and well-nourished. No distress.  HENT:  Head: Normocephalic and atraumatic.  Mouth/Throat: Oropharynx is clear and moist.  Cardiovascular: Normal rate, regular rhythm and normal heart sounds.  Exam reveals no gallop and no friction rub.   No murmur heard. Pulmonary/Chest: Effort normal and breath sounds normal.  Abdominal: Soft. Normal appearance and bowel sounds are normal. There is tenderness in the right lower quadrant. There is rebound and guarding. There is no rigidity.  Neurological: She is alert and oriented to person, place, and time.  Skin: Skin is warm and dry. No rash noted.    ED Course  Procedures (including critical care time)  Labs Reviewed  CBC WITH DIFFERENTIAL - Abnormal; Notable for the following:    RBC 3.74 (*)    Hemoglobin 11.8 (*)    HCT 35.0 (*)    Eosinophils Relative 7 (*)    All other components within normal limits  BASIC METABOLIC PANEL  URINALYSIS, ROUTINE W REFLEX MICROSCOPIC   Ct Abdomen Pelvis W Contrast  01/04/2013   *RADIOLOGY REPORT*  Clinical Data: Right flank and right lower quadrant abdominal pain for several days with some nausea.  Possible palpable mass in the right lower quadrant on physical examination today.  Previous hysterectomy and bilateral oophorectomy.  CT ABDOMEN AND PELVIS WITH CONTRAST  Technique:  Multidetector CT imaging of the abdomen and pelvis was performed following the standard protocol during bolus administration of intravenous contrast.  Contrast: 50mL OMNIPAQUE IOHEXOL 300  MG/ML  SOLN, OMNIPAQUE IOHEXOL 300 MG/ML  SOLN  Comparison: 03/21/2012.  Findings: Enlarged appendix containing fluid with diffuse wall thickening and enhancement and periappendiceal soft tissue stranding.  No extraluminal fluid collections are seen.  Minimal free peritoneal fluid in the posterior pelvis.  No free peritoneal air.  The previously demonstrated 9 mm enhancing and low density mass in the liver on the right at the junction of the anterior segment the right lobe and medial segment the left lobe currently measures 1.3 cm in maximum diameter on image number 18.  By my measurements, this measured 1.3 cm in corresponding diameter previously.  Better visualized focal fat deposition in the medial segment of the left lobe of the liver, adjacent to the falciform ligament.  Normal appearing spleen, pancreas, gallbladder, adrenal glands, kidneys and urinary bladder.  Surgically absent uterus and ovaries. No gastrointestinal abnormalities or enlarged lymph nodes.  Clear lung bases.  L4 hemangioma.  Mild lumbar spine degenerative changes.  Lower thoracic spine degenerative changes.  IMPRESSION:  1.  Acute appendicitis without abscess. 2.  Stable 1.3 cm right lobe liver mass, possibly representing an hemangioma.  These results were called by telephone on 01/04/2013 at 2115 hours to Dr. Eloise Harman, who verbally acknowledged these results.   Original Report Authenticated By: Beckie Salts, M.D.   I spoke with Dr. Ezzard Standing for general, surgery.  He'll be in to see the patient.  Patient is stable here in the emergency department   MDM  MDM Reviewed: vitals and nursing note Interpretation: labs and CT scan Consults: general surgery            Carlyle Dolly, PA-C 01/04/13 2239

## 2013-01-04 NOTE — ED Provider Notes (Signed)
Medical screening examination/treatment/procedure(s) were conducted as a shared visit with non-physician practitioner(s) and myself.  I personally evaluated the patient during the encounter.  42 year old female has a few days of gradual onset diffuse abdominal pain which is now localized right lower quadrant with localized right lower quadrant moderate tenderness without rebound and without generalized peritonitis.  Hurman Horn, MD 01/11/13 [phone]

## 2013-01-04 NOTE — ED Notes (Signed)
ZOX:[id number]<AV> Expected date:01/04/13<BR> Expected time: 9:35 PM<BR> Means of arrival:Ambulance<BR> Comments:<BR> appendicitis

## 2013-01-04 NOTE — Anesthesia Preprocedure Evaluation (Signed)
Anesthesia Evaluation  Patient identified by MRN, date of birth, ID band Patient awake    Reviewed: Allergy & Precautions, H&P , NPO status , Patient's Chart, lab work & pertinent test results  Airway Mallampati: II TM Distance: >3 FB Neck ROM: Full    Dental no notable dental hx.    Pulmonary neg pulmonary ROS,  breath sounds clear to auscultation  Pulmonary exam normal       Cardiovascular negative cardio ROS  Rhythm:Regular Rate:Normal     Neuro/Psych negative neurological ROS  negative psych ROS   GI/Hepatic negative GI ROS, Neg liver ROS,   Endo/Other  negative endocrine ROS  Renal/GU negative Renal ROS  negative genitourinary   Musculoskeletal negative musculoskeletal ROS (+)   Abdominal   Peds negative pediatric ROS (+)  Hematology  (+) Blood dyscrasia, anemia ,   Anesthesia Other Findings   Reproductive/Obstetrics negative OB ROS                           Anesthesia Physical Anesthesia Plan  ASA: II  Anesthesia Plan: General   Post-op Pain Management:    Induction: Intravenous and Rapid sequence  Airway Management Planned: Oral ETT  Additional Equipment:   Intra-op Plan:   Post-operative Plan: Extubation in OR  Informed Consent: I have reviewed the patients History and Physical, chart, labs and discussed the procedure including the risks, benefits and alternatives for the proposed anesthesia with the patient or authorized representative who has indicated his/her understanding and acceptance.   Dental advisory given  Plan Discussed with: CRNA and Surgeon  Anesthesia Plan Comments:         Anesthesia Quick Evaluation

## 2013-01-04 NOTE — ED Notes (Addendum)
MD Ezzard Standing at bedside. OR consent form given to MD to be filled out.

## 2013-01-04 NOTE — ED Notes (Signed)
PER CT staff- pt reported to outpatient CT, results showed appendicitis.  Pt c/o RLQ abd pain x several days,

## 2013-01-04 NOTE — H&P (Signed)
Re:   [name] DOB:   May 22, 1971 MRN:   [id number]  ASSESSMENT AND PLAN: 1.  Appendicitis  I discussed with the patient the indications and risks of appendiceal surgery.  The primary risks of appendiceal surgery include, but are not limited to, bleeding, infection, bowel surgery, and open surgery.  There is also the risk that the patient may have continued symptoms after surgery.  However, the likelihood of improvement in symptoms and return to the patient's normal status is good. We discussed the typical post-operative recovery course. I tried to answer the patient's questions.  Will plan to go ahead with lap appendectomy tonight.  2.  History of degenerative disk disease. 3.  Right rotator cuff - Dr. Ranell Patrick 4.  Idiopathic chronic constipation 5.  Stable mass in Right lobe of liver.  Chief Complaint  Patient presents with  . Abdominal Pain  . appendicitis    REFERRING PHYSICIAN:  Dr. Jinger Neighbors, [facility]  HISTORY OF PRESENT ILLNESS: [name] is a 42 y.o. (DOB: 03-26-1971)  white  female whose primary care physician is Dr. Judie Petit. [name] and comes to [facility] today for abdominal pain/appendicitis.  The patient was at [facility] and on Friday, 5/16, developed periumbilical pain.  She had waves of nausea, but never vomited.  The pain settled in the RLQ.  She saw her PCP today, Dr. Waynard Vandevoorde, who sent her to [facility] to get a CT scan.  She has no prior stomach, liver, or pancreas issues. She does carry the diagnosis of Idiopathic chronic constipation.  She takes 1,000 mg of MgOxide daily to have a bowel movement.  She had a negative colonoscopy by Dr. Arty Baumgartner in around March 2014. She has had a hysterectomy and left oophorectomy in Feb. 2011 by Dr. Marcelle Overlie and oophorectomy by Dr. Milton Ferguson in 2012 (though I cannot find an op note for this surgery).  WBC - 5,300 - 01/04/2013 Abdominal CT scan - 01/04/2013 - 1. Acute appendicitis without abscess, 2. Stable 1.3 cm right lobe liver mass,  possibly representing an hemangioma.    Past Medical History  Diagnosis Date  . Degenerated intervertebral disc       Past Surgical History  Procedure Laterality Date  . Abdominal hysterectomy        No current facility-administered medications for this encounter.   Current Outpatient Prescriptions  Medication Sig Dispense Refill  . calcium-vitamin D (OSCAL WITH D) 500-200 MG-UNIT per tablet Take 1 tablet by mouth daily.      . cetirizine (ZYRTEC) 10 MG tablet Take 10 mg by mouth daily.      Marland Kitchen estradiol (VIVELLE-DOT) 0.075 MG/24HR Place 1 patch onto the skin 2 (two) times a week. On mondays and thursdays      . fish oil-omega-3 fatty acids 1000 MG capsule Take 2 g by mouth daily.      Marland Kitchen ibuprofen (ADVIL,MOTRIN) 200 MG tablet Take 600 mg by mouth every 8 (eight) hours as needed. For pain.      . Magnesium Oxide 500 MG CAPS Take 1,000 mg by mouth daily.          Allergies  Allergen Reactions  . Sulfa Antibiotics Hives    REVIEW OF SYSTEMS: Skin:  No history of rash.  No history of abnormal moles. Infection:  No history of hepatitis or HIV.  No history of MRSA. Neurologic:  No history of stroke.  No history of seizure.  No history of headaches. Cardiac:  History of bradycardia.  No  history of hypertension. No history of heart disease.  No history of seeing a cardiologist. Pulmonary:  Does not smoke cigarettes.  No asthma or bronchitis.  No OSA/CPAP.  Endocrine:  No diabetes. No thyroid disease. Gastrointestinal:  See HPI.  History of Idiopathic chronic constipation - Dr. Arty Baumgartner sees for GI. Urologic:  No history of kidney stones.  No history of bladder infections. Musculoskeletal:  Right rotator cuff - Dr. Ranell Patrick - treating it with PT.  History of degenerative disk disease. Hematologic:  No bleeding disorder.  No history of anemia.  Not anticoagulated. Psycho-social:  The patient is oriented.   The patient has no obvious psychologic or social impairment to understanding our  conversation and plan.  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 Married to [employer].  Dewayne Hatch is in ER.  PHYSICAL EXAM: BP 134/73  Pulse 53  Temp(Src) 98 F (36.7 C) (Oral)  Resp 16  SpO2 100%  General: WN moderately obese WF who is alert and generally healthy appearing.  HEENT: Normal. Pupils equal. Neck: Supple. No mass.  No thyroid mass. Lymph Nodes:  No supraclavicular or cervical nodes. Lungs: Clear to auscultation and symmetric breath sounds. Heart:  RRR. No murmur or rub. Abdomen: Soft. No mass. No hernia. Bowel sounds present.  Pfannenstiel incision.  RLQ pain, but not much guarding. Rectal: Not done. Extremities:  Good strength and ROM  in upper and lower extremities. Neurologic:  Grossly intact to motor and sensory function. Psychiatric: Has normal mood and affect. Behavior is normal.   DATA REVIEWED: Notes in Epic and radiology reports.  Ovidio Kin, MD,  [facility] [address] [place]  [address]   [place], [place]    [address] Phone:  [phone] FAX:  [phone]

## 2013-01-05 ENCOUNTER — Encounter (HOSPITAL_COMMUNITY): Payer: Self-pay | Admitting: Surgery

## 2013-01-05 DIAGNOSIS — K3580 Unspecified acute appendicitis: Secondary | ICD-10-CM

## 2013-01-05 DIAGNOSIS — Z9049 Acquired absence of other specified parts of digestive tract: Secondary | ICD-10-CM

## 2013-01-05 MED ORDER — DEXTROSE 5 % IV SOLN
1.0000 g | INTRAVENOUS | Status: DC | PRN
  Administered 2013-01-04: 1 g via INTRAVENOUS

## 2013-01-05 MED ORDER — ONDANSETRON HCL 4 MG/2ML IJ SOLN: 4.0000 mg | Freq: Four times a day (QID) | INTRAMUSCULAR | Status: DC | PRN

## 2013-01-05 MED ORDER — ROCURONIUM BROMIDE 100 MG/10ML IV SOLN
INTRAVENOUS | Status: DC | PRN
  Administered 2013-01-05: 35 mg via INTRAVENOUS
  Administered 2013-01-05: 10 mg via INTRAVENOUS

## 2013-01-05 MED ORDER — DEXTROSE 5 % IV SOLN
2.0000 g | Freq: Four times a day (QID) | INTRAVENOUS | Status: DC
  Administered 2013-01-05 (×2): 2 g via INTRAVENOUS
  Filled 2013-01-05 (×4): qty 2

## 2013-01-05 MED ORDER — FENTANYL CITRATE 0.05 MG/ML IJ SOLN
INTRAMUSCULAR | Status: DC | PRN
  Administered 2013-01-04 – 2013-01-05 (×5): 50 ug via INTRAVENOUS

## 2013-01-05 MED ORDER — LIDOCAINE HCL (CARDIAC) 20 MG/ML IV SOLN
INTRAVENOUS | Status: DC | PRN
  Administered 2013-01-04: 100 mg via INTRAVENOUS

## 2013-01-05 MED ORDER — NEOSTIGMINE METHYLSULFATE 1 MG/ML IJ SOLN
INTRAMUSCULAR | Status: DC | PRN
  Administered 2013-01-05: 5 mg via INTRAVENOUS

## 2013-01-05 MED ORDER — KCL IN DEXTROSE-NACL 20-5-0.45 MEQ/L-%-% IV SOLN
INTRAVENOUS | Status: DC
  Administered 2013-01-05: 03:00:00 via INTRAVENOUS
  Filled 2013-01-05 (×3): qty 1000

## 2013-01-05 MED ORDER — ONDANSETRON HCL 4 MG/2ML IJ SOLN
INTRAMUSCULAR | Status: DC | PRN
  Administered 2013-01-05: 4 mg via INTRAVENOUS

## 2013-01-05 MED ORDER — BUPIVACAINE HCL (PF) 0.25 % IJ SOLN
INTRAMUSCULAR | Status: DC | PRN
  Administered 2013-01-05: 30 mL

## 2013-01-05 MED ORDER — HYDROCODONE-ACETAMINOPHEN 5-325 MG PO TABS: 1.0000 | ORAL_TABLET | ORAL | Status: DC | PRN

## 2013-01-05 MED ORDER — KETOROLAC TROMETHAMINE 30 MG/ML IJ SOLN: 15.0000 mg | Freq: Once | INTRAMUSCULAR | Status: DC | PRN

## 2013-01-05 MED ORDER — FENTANYL CITRATE 0.05 MG/ML IJ SOLN
INTRAMUSCULAR | Status: AC
  Filled 2013-01-05: qty 2

## 2013-01-05 MED ORDER — IBUPROFEN 600 MG PO TABS
600.0000 mg | ORAL_TABLET | Freq: Four times a day (QID) | ORAL | Status: DC | PRN
  Administered 2013-01-05 (×2): 600 mg via ORAL
  Filled 2013-01-05: qty 1

## 2013-01-05 MED ORDER — FENTANYL CITRATE 0.05 MG/ML IJ SOLN
25.0000 ug | INTRAMUSCULAR | Status: DC | PRN
  Administered 2013-01-05: 50 ug via INTRAVENOUS
  Administered 2013-01-05 (×2): 25 ug via INTRAVENOUS

## 2013-01-05 MED ORDER — HEPARIN SODIUM (PORCINE) 5000 UNIT/ML IJ SOLN
5000.0000 [IU] | Freq: Three times a day (TID) | INTRAMUSCULAR | Status: DC
  Administered 2013-01-05: 5000 [IU] via SUBCUTANEOUS
  Filled 2013-01-05 (×4): qty 1

## 2013-01-05 MED ORDER — MIDAZOLAM HCL 5 MG/5ML IJ SOLN
INTRAMUSCULAR | Status: DC | PRN
  Administered 2013-01-04: 2 mg via INTRAVENOUS

## 2013-01-05 MED ORDER — MORPHINE SULFATE 2 MG/ML IJ SOLN
1.0000 mg | INTRAMUSCULAR | Status: DC | PRN
  Administered 2013-01-05 (×2): 2 mg via INTRAVENOUS
  Filled 2013-01-05 (×3): qty 1

## 2013-01-05 MED ORDER — LACTATED RINGERS IV SOLN
INTRAVENOUS | Status: DC | PRN
  Administered 2013-01-04: via INTRAVENOUS

## 2013-01-05 MED ORDER — PROMETHAZINE HCL 25 MG/ML IJ SOLN: 6.2500 mg | INTRAMUSCULAR | Status: DC | PRN

## 2013-01-05 MED ORDER — PROPOFOL 10 MG/ML IV BOLUS
INTRAVENOUS | Status: DC | PRN
  Administered 2013-01-04: 150 mg via INTRAVENOUS

## 2013-01-05 MED ORDER — ONDANSETRON HCL 4 MG PO TABS: 4.0000 mg | ORAL_TABLET | Freq: Four times a day (QID) | ORAL | Status: DC | PRN

## 2013-01-05 MED ORDER — DEXAMETHASONE SODIUM PHOSPHATE 10 MG/ML IJ SOLN
INTRAMUSCULAR | Status: DC | PRN
  Administered 2013-01-05: 8 mg via INTRAVENOUS

## 2013-01-05 MED ORDER — SUCCINYLCHOLINE CHLORIDE 20 MG/ML IJ SOLN
INTRAMUSCULAR | Status: DC | PRN
  Administered 2013-01-04: 100 mg via INTRAVENOUS

## 2013-01-05 MED ORDER — ACETAMINOPHEN 10 MG/ML IV SOLN
INTRAVENOUS | Status: DC | PRN
  Administered 2013-01-04: 1000 mg via INTRAVENOUS

## 2013-01-05 MED ORDER — METOCLOPRAMIDE HCL 5 MG/ML IJ SOLN
INTRAMUSCULAR | Status: DC | PRN
  Administered 2013-01-05: 10 mg via INTRAVENOUS

## 2013-01-05 MED ORDER — HYDROCODONE-ACETAMINOPHEN 5-325 MG PO TABS
1.0000 | ORAL_TABLET | ORAL | Status: DC | PRN
  Administered 2013-01-05: 2 via ORAL
  Filled 2013-01-05: qty 2

## 2013-01-05 MED ORDER — GLYCOPYRROLATE 0.2 MG/ML IJ SOLN
INTRAMUSCULAR | Status: DC | PRN
  Administered 2013-01-05: 0.2 mg via INTRAVENOUS
  Administered 2013-01-05: 0.6 mg via INTRAVENOUS

## 2013-01-05 NOTE — Progress Notes (Signed)
Patient ID: [name], female   DOB: 1971-03-15, 42 y.o.   MRN: [id number]  1 Day Post-Op  Subjective: Pt feels great, ready to go home.  Tolerated clear liquid diet.  She has been oob to bathroom.  No flatus yet.  Reports some soreness on the left side.  Denies fever, chills.    Objective: Vital signs in last 24 hours: Temp:  [97.3 F (36.3 C)-98 F (36.7 C)] 97.6 F (36.4 C) (05/20 0545) Pulse Rate:  [50-62] 50 (05/20 0545) Resp:  [16-20] 16 (05/20 0545) BP: (120-137)/(64-91) 126/86 mmHg (05/20 0545) SpO2:  [99 %-100 %] 100 % (05/20 0545)    Intake/Output from previous day: 05/19 0701 - 05/20 0700 In: 1600 [P.O.:75; I.V.:1475; IV Piggyback:50] Out: 250 [Urine:250] Intake/Output this shift: Total I/O In: -  Out: 100 [Urine:100]  PE: Gen:  Alert, NAD, pleasant Card:  RRR, no M/G/R heard Pulm:  CTA, no W/R/R Abd: Soft, NT/ND, +BS, no HSM, incisions C/D/I, without drainage. Ext:  No erythema, edema, or tenderness   Lab Results:   Recent Labs  01/04/13 2212  WBC 5.3  HGB 11.8*  HCT 35.0*  PLT 310   BMET  Recent Labs  01/04/13 2212  NA 137  K 4.6  CL 100  CO2 27  GLUCOSE 89  BUN 7  CREATININE 0.71  CALCIUM 9.1   PT/INR No results found for this basename: LABPROT, INR,  in the last 72 hours CMP     Component Value Date/Time   NA 137 01/04/2013 2212   K 4.6 01/04/2013 2212   CL 100 01/04/2013 2212   CO2 27 01/04/2013 2212   GLUCOSE 89 01/04/2013 2212   BUN 7 01/04/2013 2212   CREATININE 0.71 01/04/2013 2212   CALCIUM 9.1 01/04/2013 2212   PROT 7.3 03/21/2012 1400   ALBUMIN 4.4 03/21/2012 1400   AST 29 03/21/2012 1400   ALT 27 03/21/2012 1400   ALKPHOS 53 03/21/2012 1400   BILITOT 0.5 03/21/2012 1400   GFRNONAA >90 01/04/2013 2212   GFRAA >90 01/04/2013 2212   Lipase     Component Value Date/Time   LIPASE 15 08/02/2009 0831       Studies/Results: Ct Abdomen Pelvis W Contrast  01/04/2013   *RADIOLOGY REPORT*  Clinical Data: Right flank and right  lower quadrant abdominal pain for several days with some nausea.  Possible palpable mass in the right lower quadrant on physical examination today.  Previous hysterectomy and bilateral oophorectomy.  CT ABDOMEN AND PELVIS WITH CONTRAST  Technique:  Multidetector CT imaging of the abdomen and pelvis was performed following the standard protocol during bolus administration of intravenous contrast.  Contrast: 50mL OMNIPAQUE IOHEXOL 300 MG/ML  SOLN, OMNIPAQUE IOHEXOL 300 MG/ML  SOLN  Comparison: 03/21/2012.  Findings: Enlarged appendix containing fluid with diffuse wall thickening and enhancement and periappendiceal soft tissue stranding.  No extraluminal fluid collections are seen.  Minimal free peritoneal fluid in the posterior pelvis.  No free peritoneal air.  The previously demonstrated 9 mm enhancing and low density mass in the liver on the right at the junction of the anterior segment the right lobe and medial segment the left lobe currently measures 1.3 cm in maximum diameter on image number 18.  By my measurements, this measured 1.3 cm in corresponding diameter previously.  Better visualized focal fat deposition in the medial segment of the left lobe of the liver, adjacent to the falciform ligament.  Normal appearing spleen, pancreas, gallbladder, adrenal glands, kidneys and  urinary bladder.  Surgically absent uterus and ovaries. No gastrointestinal abnormalities or enlarged lymph nodes.  Clear lung bases.  L4 hemangioma.  Mild lumbar spine degenerative changes.  Lower thoracic spine degenerative changes.  IMPRESSION:  1.  Acute appendicitis without abscess. 2.  Stable 1.3 cm right lobe liver mass, possibly representing an hemangioma.  These results were called by telephone on 01/04/2013 at 2115 hours to Dr. Eloise Harman, who verbally acknowledged these results.   Original Report Authenticated By: Beckie Salts, M.D.    Anti-infectives: Anti-infectives   Start     Dose/Rate Route Frequency Ordered Stop    01/05/13 0600  cefOXitin (MEFOXIN) 2 g in dextrose 5 % 50 mL IVPB     2 g 100 mL/hr over 30 Minutes Intravenous Every 6 hours 01/05/13 0249         Assessment/Plan  Acute Appendicitis s/p laparoscopic appendectomy -continue atbx until discharge, she will not need home atbx -regular diet, ambulate in hallways -pain control -IS -DC later today  VTE prophylaxis -heparin, SCDs, ambulate   LOS: 1 day    Jazmine Heckman ANP-BC 01/05/2013, 7:51 AM  [phone]

## 2013-01-05 NOTE — Anesthesia Postprocedure Evaluation (Signed)
  Anesthesia Post-op Note  Patient: [name]  Procedure(s) Performed: Procedure(s): APPENDECTOMY LAPAROSCOPIC (N/A)  Patient Location: PACU  Anesthesia Type:General  Level of Consciousness: awake, alert  and oriented  Airway and Oxygen Therapy: Patient Spontanous Breathing and Patient connected to face mask oxygen  Post-op Pain: mild  Post-op Assessment: Post-op Vital signs reviewed, Patient's Cardiovascular Status Stable, Respiratory Function Stable, Patent Airway and No signs of Nausea or vomiting  Post-op Vital Signs: Reviewed and stable  Complications: No apparent anesthesia complications

## 2013-01-05 NOTE — Transfer of Care (Signed)
Immediate Anesthesia Transfer of Care Note  Patient: [name]  Procedure(s) Performed: Procedure(s) (LRB): APPENDECTOMY LAPAROSCOPIC (N/A)  Patient Location: PACU  Anesthesia Type: General  Level of Consciousness: sedated, patient cooperative and responds to stimulaton  Airway & Oxygen Therapy: Patient Spontanous Breathing and Patient connected to face mask oxgen  Post-op Assessment: Report given to PACU RN and Post -op Vital signs reviewed and stable  Post vital signs: Reviewed and stable  Complications: No apparent anesthesia complications

## 2013-01-05 NOTE — Op Note (Signed)
Re:   [name] DOB:   1971-07-08 MRN:   [id number]                   FACILITY:  [facility]  DATE OF PROCEDURE: 01/05/2013                              OPERATIVE REPORT  PREOPERATIVE DIAGNOSIS:  Appendicitis  POSTOPERATIVE DIAGNOSIS:  Acute suppurative appendicitis.  PROCEDURE:  Laparoscopic appendectomy.  SURGEON:  Sandria Bales. Ezzard Standing, MD  ASSISTANT:  No first assistant.  ANESTHESIA:  General endotracheal.  Anesthesiologist: Eilene Ghazi, MD CRNA: Mechele Dawley, CRNA  ESTIMATED BLOOD LOSS:  Minimal.  DRAINS: none   SPECIMEN:   Appendix  COUNTS CORRECT:  YES  INDICATIONS FOR PROCEDURE: [name] is a 42 y.o. (DOB: 1971/08/17) white  female whose primary care doctor is Dr. Judie Petit. [name] and comes to the OR for an appendectomy.   I discussed with the patient, the indications and potential complications of appendiceal surgery.  The potential complications include, but are not limited to, bleeding, open surgery, bowel resection, and the possibility of another diagnosis.  OPERATIVE NOTE:  The patient underwent a general endotracheal anesthetic as supervised by Anesthesiologist: Eilene Ghazi, MD CRNA: Mechele Dawley, CRNA, General, in room #1.  The patient was given 1 gm of cefoxitin at the beginning of the procedure and the abdomen was prepped with ChloraPrep.  The patient had a foley catheter placed at the beginning of the procedure.  A time-out was held and surgical checklist run.  An infraumbilical incision was made but I was unable to easily get into the abdominal cavity.  Therefore I did a 5 mm Optiview in the RUQ to access the abdominal cavity.  I then placed a 12 mm Ethicon trocar at the infraumbilical incision and into the peritoneal cavity.    I placed a 5 mm trocar in the left lower quadrant.  I used a 0 degree 5 mm scope and did abdominal exploration.    The right and left lobes of liver unremarkable.  Stomach was unremarkable.  The patient's uterus and  both ovaries were absent..  I saw no other intra-abdominal abnormality.  The patient had appendicitis with the appendix located laterally and anterior ly along the right pelvic brim.  The mesentery of the appendix was divided with a Harmonic scalpel.  I got to the base of the appendix.  I then used a blue load 45 mm Ethicon Endo-GIA stapler and fired this across the base of the appendix.  I placed the appendix in EndoCatch bag and delivered the bag through the umbilical incision.  I irrigated the abdomen with 500 cc of saline.  After irrigating the abdomen, I then removed the trocars, in turn.  The umbilical port fascia was closed with 0 Vicryl suture x 2 using an Endoclose.   I closed the skin each site with a 5-0 Monocryl suture and painted the wounds with Dermabond.  I then injected a total of 30 mL of 0.25% Marcaine at the incisions.  Sponge and needle count were correct at the end of the case.  The foley catheter was removed in the OR.  The patient was transferred to the recovery room in good condition.  The patient tolerated the procedure well and it depends on the patient's post op clinical course as to when she could be discharged.   Ovidio Kin, MD, [facility]  Surgery Pager: [phone] Office phone:  [phone]

## 2013-01-05 NOTE — Discharge Summary (Signed)
Physician Discharge Summary  Patient ID: [name] MRN: [id number] DOB/AGE: 1971/05/04 42 y.o.  Admit date: 01/04/2013 Discharge date: 01/05/2013  Admitting Diagnosis: Acute Appendicitis  Discharge Diagnosis Patient Active Problem List   Diagnosis Date Noted  . S/P laparoscopic appendectomy 01/05/2013  . Acute appendicitis with generalized peritonitis 01/04/2013    Consultants none  Imaging: Ct Abdomen Pelvis W Contrast  01/04/2013   *RADIOLOGY REPORT*  Clinical Data: Right flank and right lower quadrant abdominal pain for several days with some nausea.  Possible palpable mass in the right lower quadrant on physical examination today.  Previous hysterectomy and bilateral oophorectomy.  CT ABDOMEN AND PELVIS WITH CONTRAST  Technique:  Multidetector CT imaging of the abdomen and pelvis was performed following the standard protocol during bolus administration of intravenous contrast.  Contrast: 50mL OMNIPAQUE IOHEXOL 300 MG/ML  SOLN, OMNIPAQUE IOHEXOL 300 MG/ML  SOLN  Comparison: 03/21/2012.  Findings: Enlarged appendix containing fluid with diffuse wall thickening and enhancement and periappendiceal soft tissue stranding.  No extraluminal fluid collections are seen.  Minimal free peritoneal fluid in the posterior pelvis.  No free peritoneal air.  The previously demonstrated 9 mm enhancing and low density mass in the liver on the right at the junction of the anterior segment the right lobe and medial segment the left lobe currently measures 1.3 cm in maximum diameter on image number 18.  By my measurements, this measured 1.3 cm in corresponding diameter previously.  Better visualized focal fat deposition in the medial segment of the left lobe of the liver, adjacent to the falciform ligament.  Normal appearing spleen, pancreas, gallbladder, adrenal glands, kidneys and urinary bladder.  Surgically absent uterus and ovaries. No gastrointestinal abnormalities or enlarged lymph nodes.   Clear lung bases.  L4 hemangioma.  Mild lumbar spine degenerative changes.  Lower thoracic spine degenerative changes.  IMPRESSION:  1.  Acute appendicitis without abscess. 2.  Stable 1.3 cm right lobe liver mass, possibly representing an hemangioma.  These results were called by telephone on 01/04/2013 at 2115 hours to Dr. Eloise Harman, who verbally acknowledged these results.   Original Report Authenticated By: Beckie Salts, M.D.    Procedures Laparoscopic Appendectomy  Hospital Course:  [name] is a healthy 42 year old female  who presented to [facility] ED with RLQ abdominal pain.  Workup showed acute appendicitis.  Patient was admitted and underwent procedure listed above.  Tolerated procedure well and was transferred to the floor.  Diet was advanced as tolerated.  On POD #1, the patient was voiding well, tolerating diet, ambulating well, pain well controlled, vital signs stable, incisions c/d/i and felt stable for discharge home.  Patient will follow up in our office in 2 weeks.  Warning signs that warrant immediate attention were discussed.  She was given a prescription for pain medication, risks, benefits and therapeutic alternatives were discussed.    Physical Exam: General:  Alert, NAD, pleasant, comfortable Abd:  Soft, ND, mild tenderness, incisions C/D/I and without any drainage.    Medication List    TAKE these medications       calcium-vitamin D 500-200 MG-UNIT per tablet  Commonly known as:  OSCAL WITH D  Take 1 tablet by mouth daily.     cetirizine 10 MG tablet  Commonly known as:  ZYRTEC  Take 10 mg by mouth daily.     estradiol 0.075 MG/24HR  Commonly known as:  VIVELLE-DOT  Place 1 patch onto the skin 2 (two) times a week. On mondays  and thursdays     fish oil-omega-3 fatty acids 1000 MG capsule  Take 2 g by mouth daily.     HYDROcodone-acetaminophen 5-325 MG per tablet  Commonly known as:  NORCO/VICODIN  Take 1-2 tablets by mouth every 4 (four) hours as  needed.     ibuprofen 200 MG tablet  Commonly known as:  ADVIL,MOTRIN  Take 600 mg by mouth every 8 (eight) hours as needed. For pain.     Magnesium Oxide 500 MG Caps  Take 1,000 mg by mouth daily.             Follow-up Information   Follow up with [facility]. Schedule an appointment as soon as possible for a visit in 2 weeks. (please call to schedule at follow up with doctor of the week clinic for a wound check.)    Contact information:   [address]   [place] [address] [phone]       Signed: Ashok Norris, [facility] [phone]  01/05/2013, 11:12 AM

## 2013-01-07 ENCOUNTER — Telehealth (INDEPENDENT_AMBULATORY_CARE_PROVIDER_SITE_OTHER): Payer: Self-pay

## 2013-01-07 NOTE — Telephone Encounter (Signed)
F/U appt with Dr. Ezzard Standing 02/04/13@12n  pt aware

## 2013-01-11 NOTE — ED Provider Notes (Signed)
Medical screening examination/treatment/procedure(s) were performed by non-physician practitioner and as supervising physician I was immediately available for consultation/collaboration.   Zalyn Amend M Isac Lincks, MD 01/11/13 1608 

## 2013-02-04 ENCOUNTER — Encounter (INDEPENDENT_AMBULATORY_CARE_PROVIDER_SITE_OTHER): Payer: BC Managed Care – PPO | Admitting: Surgery

## 2013-02-11 ENCOUNTER — Encounter (INDEPENDENT_AMBULATORY_CARE_PROVIDER_SITE_OTHER): Payer: BC Managed Care – PPO | Admitting: Surgery

## 2013-06-24 ENCOUNTER — Other Ambulatory Visit: Payer: Self-pay

## 2014-01-06 ENCOUNTER — Other Ambulatory Visit: Payer: Self-pay | Admitting: Obstetrics and Gynecology

## 2014-06-03 ENCOUNTER — Other Ambulatory Visit: Payer: Self-pay

## 2014-09-30 ENCOUNTER — Telehealth: Payer: Self-pay | Admitting: Internal Medicine

## 2014-09-30 NOTE — Telephone Encounter (Signed)
error 

## 2015-03-14 ENCOUNTER — Other Ambulatory Visit: Payer: Self-pay | Admitting: Obstetrics and Gynecology

## 2015-03-15 LAB — CYTOLOGY - PAP

## 2017-07-14 ENCOUNTER — Other Ambulatory Visit: Payer: Self-pay | Admitting: Internal Medicine

## 2017-07-14 DIAGNOSIS — Z139 Encounter for screening, unspecified: Secondary | ICD-10-CM

## 2017-08-26 ENCOUNTER — Ambulatory Visit
Admission: RE | Admit: 2017-08-26 | Discharge: 2017-08-26 | Disposition: A | Discharge status: Home / Self Care | Payer: BLUE CROSS/BLUE SHIELD | Source: Ambulatory Visit | Attending: Internal Medicine | Admitting: Internal Medicine

## 2017-08-26 DIAGNOSIS — Z139 Encounter for screening, unspecified: Secondary | ICD-10-CM

## 2018-08-17 ENCOUNTER — Other Ambulatory Visit: Payer: Self-pay | Admitting: Internal Medicine

## 2018-08-17 DIAGNOSIS — Z1231 Encounter for screening mammogram for malignant neoplasm of breast: Secondary | ICD-10-CM

## 2018-09-15 ENCOUNTER — Ambulatory Visit
Admission: RE | Admit: 2018-09-15 | Discharge: 2018-09-15 | Disposition: A | Discharge status: Home / Self Care | Payer: BLUE CROSS/BLUE SHIELD | Source: Ambulatory Visit | Attending: Internal Medicine | Admitting: Internal Medicine

## 2018-09-15 DIAGNOSIS — Z1231 Encounter for screening mammogram for malignant neoplasm of breast: Secondary | ICD-10-CM

## 2019-04-30 ENCOUNTER — Encounter: Payer: Self-pay | Admitting: Podiatry

## 2019-04-30 ENCOUNTER — Other Ambulatory Visit: Payer: Self-pay

## 2019-04-30 ENCOUNTER — Ambulatory Visit (INDEPENDENT_AMBULATORY_CARE_PROVIDER_SITE_OTHER): Payer: BC Managed Care – PPO

## 2019-04-30 ENCOUNTER — Other Ambulatory Visit: Payer: Self-pay | Admitting: Podiatry

## 2019-04-30 ENCOUNTER — Ambulatory Visit (INDEPENDENT_AMBULATORY_CARE_PROVIDER_SITE_OTHER): Payer: BC Managed Care – PPO | Admitting: Podiatry

## 2019-04-30 DIAGNOSIS — M79671 Pain in right foot: Secondary | ICD-10-CM

## 2019-04-30 DIAGNOSIS — M7661 Achilles tendinitis, right leg: Secondary | ICD-10-CM

## 2019-04-30 DIAGNOSIS — M79672 Pain in left foot: Secondary | ICD-10-CM

## 2019-04-30 DIAGNOSIS — M7662 Achilles tendinitis, left leg: Secondary | ICD-10-CM

## 2019-04-30 NOTE — Progress Notes (Signed)
   Subjective:    Patient ID: [name], female    DOB: 05/11/1971, 48 y.o.   MRN: [id number]  HPI    Review of Systems  All other systems reviewed and are negative.      Objective:   Physical Exam        Assessment & Plan:

## 2019-04-30 NOTE — Patient Instructions (Signed)

## 2019-05-01 NOTE — Progress Notes (Signed)
Subjective:   Patient ID: [name], female   DOB: 48 y.o.   MRN: [id number]   HPI Patient states that she has had problems with the back of her heels and states that it is been going on for a while and is been worse recently.  She is tried shoe gear modifications anti-inflammatories which have given some benefit but she just finds it to be persistent with the left being worse than the right.  Patient does not smoke and would like to be more active   Review of Systems  All other systems reviewed and are negative.       Objective:  Physical Exam Vitals signs and nursing note reviewed.  Constitutional:      Appearance: She is well-developed.  Pulmonary:     Effort: Pulmonary effort is normal.  Musculoskeletal: Normal range of motion.  Skin:    General: Skin is warm.  Neurological:     Mental Status: She is alert.     Neurovascular status found to be intact muscle strength was found to be adequate range of motion within normal limits with mild equinus condition bilateral.  Patient has posterior pain heel region left and right with the left being slightly more intense medial side localized in nature with no central lateral pain noted     Assessment:  Achilles tendinitis left medial side and right medial side with pain and no indication of tendon dysfunction     Plan:  H&P reviewed all conditions and today recommended careful injection explaining risk and I went ahead today did sterile prep and then injected the medial side with 3 mg Dexasone Kenalog 5 g Xylocaine keep keeping away from central lateral advised on reduced activity ice therapy and anti-inflammatories.  Reappoint 3 weeks to recheck  X-rays indicate small spur formation posterior aspect heel region bilateral with no indication of arthritic process

## 2019-05-21 ENCOUNTER — Ambulatory Visit (INDEPENDENT_AMBULATORY_CARE_PROVIDER_SITE_OTHER): Payer: BC Managed Care – PPO | Admitting: Podiatry

## 2019-05-21 ENCOUNTER — Encounter: Payer: Self-pay | Admitting: Podiatry

## 2019-05-21 ENCOUNTER — Other Ambulatory Visit: Payer: Self-pay

## 2019-05-21 DIAGNOSIS — M7661 Achilles tendinitis, right leg: Secondary | ICD-10-CM

## 2019-05-21 DIAGNOSIS — M7662 Achilles tendinitis, left leg: Secondary | ICD-10-CM | POA: Diagnosis not present

## 2019-05-21 NOTE — Progress Notes (Signed)
Subjective:   Patient ID: [name], female   DOB: 48 y.o.   MRN: [id number]   HPI Patient presents stating that she is doing much better on the left with moderate discomfort on the right that was not worked on and is very satisfied with how she is recovering and is wearing heel lifts and doing stretching exercises   ROS      Objective:  Physical Exam  Neurovascular status intact with significant diminishment of discomfort posterior Achilles medial side left with moderate discomfort right medial side present     Assessment:  Achilles tendinitis bilateral that is improving left with mild pain right     Plan:  H&P reviewed conditions and I did recommend careful injection right explaining risk of rupture and she wants this done.  I did sterile prep of the medial side carefully injected 3 mg dexamethasone 5 mg Xylocaine advised on reduced activity and gradual increase stretching continued heel lift usage and reappoint as symptoms indicate

## 2019-11-15 ENCOUNTER — Other Ambulatory Visit: Payer: Self-pay | Admitting: Internal Medicine

## 2019-11-15 DIAGNOSIS — M25551 Pain in right hip: Secondary | ICD-10-CM

## 2019-11-15 DIAGNOSIS — M5416 Radiculopathy, lumbar region: Secondary | ICD-10-CM

## 2019-11-15 DIAGNOSIS — Z1231 Encounter for screening mammogram for malignant neoplasm of breast: Secondary | ICD-10-CM

## 2019-11-15 DIAGNOSIS — M549 Dorsalgia, unspecified: Secondary | ICD-10-CM

## 2019-11-23 ENCOUNTER — Ambulatory Visit
Admission: RE | Admit: 2019-11-23 | Discharge: 2019-11-23 | Disposition: A | Discharge status: Home / Self Care | Payer: BC Managed Care – PPO | Source: Ambulatory Visit | Attending: Internal Medicine | Admitting: Internal Medicine

## 2019-11-23 ENCOUNTER — Other Ambulatory Visit: Payer: Self-pay

## 2019-11-23 DIAGNOSIS — Z1231 Encounter for screening mammogram for malignant neoplasm of breast: Secondary | ICD-10-CM

## 2019-12-14 ENCOUNTER — Ambulatory Visit
Admission: RE | Admit: 2019-12-14 | Discharge: 2019-12-14 | Disposition: A | Discharge status: Home / Self Care | Payer: BLUE CROSS/BLUE SHIELD | Source: Ambulatory Visit | Attending: Internal Medicine | Admitting: Internal Medicine

## 2019-12-14 ENCOUNTER — Other Ambulatory Visit: Payer: Self-pay

## 2019-12-14 DIAGNOSIS — M549 Dorsalgia, unspecified: Secondary | ICD-10-CM

## 2019-12-14 DIAGNOSIS — M5416 Radiculopathy, lumbar region: Secondary | ICD-10-CM

## 2019-12-14 DIAGNOSIS — M25551 Pain in right hip: Secondary | ICD-10-CM

## 2019-12-14 MED ORDER — GADOBENATE DIMEGLUMINE 529 MG/ML IV SOLN
17.0000 mL | Freq: Once | INTRAVENOUS | Status: AC | PRN
  Administered 2019-12-14: 17 mL via INTRAVENOUS

## 2020-05-30 ENCOUNTER — Other Ambulatory Visit: Payer: Self-pay

## 2020-05-30 ENCOUNTER — Encounter (HOSPITAL_COMMUNITY): Payer: Self-pay | Admitting: Orthopedic Surgery

## 2020-05-30 NOTE — H&P (View-Only) (Signed)
COVID Vaccine Completed: Yes Date COVID Vaccine completed: 03/2020 booster shot received COVID vaccine manufacturer: Pfizer     PCP -Dr. Crist Infante  Cardiologist - N/A   Chest x-ray -N/A  EKG - N/A Stress Test -N/A  ECHO - N/A Cardiac Cath - N/A Pacemaker/ICD device last checked:N/A   Sleep Study - N/A CPAP - N/A  Fasting Blood Sugar - N/A Checks Blood Sugar __N/A___ times a day  Blood Thinner Instructions: N/A Aspirin Instructions:N/A Last Dose:N/A   Anesthesia review: N/A   Patient denies shortness of breath, fever, cough and chest pain at PAT appointment   Patient verbalized understanding of instructions that were given to them at the PAT appointment. Patient was also instructed that they will need to review over the PAT instructions again at home before surgery.

## 2020-05-30 NOTE — Progress Notes (Signed)
COVID Vaccine Completed: Yes Date COVID Vaccine completed: 03/2020 booster shot received COVID vaccine manufacturer: Pfizer     PCP -Dr. Crist Infante  Cardiologist - N/A   Chest x-ray -N/A  EKG - N/A Stress Test -N/A  ECHO - N/A Cardiac Cath - N/A Pacemaker/ICD device last checked:N/A   Sleep Study - N/A CPAP - N/A  Fasting Blood Sugar - N/A Checks Blood Sugar __N/A___ times a day  Blood Thinner Instructions: N/A Aspirin Instructions:N/A Last Dose:N/A   Anesthesia review: N/A   Patient denies shortness of breath, fever, cough and chest pain at PAT appointment   Patient verbalized understanding of instructions that were given to them at the PAT appointment. Patient was also instructed that they will need to review over the PAT instructions again at home before surgery.

## 2020-05-31 ENCOUNTER — Ambulatory Visit (HOSPITAL_COMMUNITY): Payer: BC Managed Care – PPO | Admitting: Anesthesiology

## 2020-05-31 ENCOUNTER — Encounter (HOSPITAL_COMMUNITY)
Admission: RE | Disposition: A | Discharge status: Home / Self Care | Payer: Self-pay | Source: Home / Self Care | Attending: Orthopedic Surgery

## 2020-05-31 ENCOUNTER — Encounter (HOSPITAL_COMMUNITY): Payer: Self-pay | Admitting: Orthopedic Surgery

## 2020-05-31 ENCOUNTER — Telehealth (HOSPITAL_COMMUNITY): Payer: Self-pay | Admitting: *Deleted

## 2020-05-31 ENCOUNTER — Ambulatory Visit (HOSPITAL_COMMUNITY)
Admission: RE | Admit: 2020-05-31 | Discharge: 2020-05-31 | Disposition: A | Discharge status: Home / Self Care | Payer: BC Managed Care – PPO | Attending: Orthopedic Surgery | Admitting: Orthopedic Surgery

## 2020-05-31 DIAGNOSIS — X58XXXA Exposure to other specified factors, initial encounter: Secondary | ICD-10-CM | POA: Insufficient documentation

## 2020-05-31 DIAGNOSIS — K219 Gastro-esophageal reflux disease without esophagitis: Secondary | ICD-10-CM | POA: Diagnosis not present

## 2020-05-31 DIAGNOSIS — Z882 Allergy status to sulfonamides status: Secondary | ICD-10-CM | POA: Insufficient documentation

## 2020-05-31 DIAGNOSIS — M1990 Unspecified osteoarthritis, unspecified site: Secondary | ICD-10-CM | POA: Insufficient documentation

## 2020-05-31 DIAGNOSIS — Z79899 Other long term (current) drug therapy: Secondary | ICD-10-CM | POA: Insufficient documentation

## 2020-05-31 DIAGNOSIS — Z96641 Presence of right artificial hip joint: Secondary | ICD-10-CM | POA: Diagnosis not present

## 2020-05-31 DIAGNOSIS — E669 Obesity, unspecified: Secondary | ICD-10-CM | POA: Insufficient documentation

## 2020-05-31 DIAGNOSIS — T8189XA Other complications of procedures, not elsewhere classified, initial encounter: Secondary | ICD-10-CM | POA: Insufficient documentation

## 2020-05-31 DIAGNOSIS — F419 Anxiety disorder, unspecified: Secondary | ICD-10-CM | POA: Diagnosis not present

## 2020-05-31 DIAGNOSIS — Z6838 Body mass index (BMI) 38.0-38.9, adult: Secondary | ICD-10-CM | POA: Insufficient documentation

## 2020-05-31 DIAGNOSIS — Z20822 Contact with and (suspected) exposure to covid-19: Secondary | ICD-10-CM | POA: Insufficient documentation

## 2020-05-31 DIAGNOSIS — Y9389 Activity, other specified: Secondary | ICD-10-CM | POA: Diagnosis not present

## 2020-05-31 DIAGNOSIS — Z7982 Long term (current) use of aspirin: Secondary | ICD-10-CM | POA: Diagnosis not present

## 2020-05-31 DIAGNOSIS — S7001XA Contusion of right hip, initial encounter: Secondary | ICD-10-CM | POA: Diagnosis present

## 2020-05-31 DIAGNOSIS — E785 Hyperlipidemia, unspecified: Secondary | ICD-10-CM | POA: Insufficient documentation

## 2020-05-31 DIAGNOSIS — F32A Depression, unspecified: Secondary | ICD-10-CM | POA: Diagnosis not present

## 2020-05-31 HISTORY — DX: Hyperlipidemia, unspecified: E78.5

## 2020-05-31 HISTORY — DX: Other seasonal allergic rhinitis: J30.2

## 2020-05-31 HISTORY — PX: INCISION AND DRAINAGE: SHX5863

## 2020-05-31 HISTORY — DX: Anxiety disorder, unspecified: F41.9

## 2020-05-31 HISTORY — DX: Other specified postprocedural states: Z98.890

## 2020-05-31 HISTORY — DX: Anemia, unspecified: D64.9

## 2020-05-31 HISTORY — DX: Gastro-esophageal reflux disease without esophagitis: K21.9

## 2020-05-31 HISTORY — DX: Nausea with vomiting, unspecified: R11.2

## 2020-05-31 HISTORY — DX: Depression, unspecified: F32.A

## 2020-05-31 LAB — CBC
HCT: 31.9 % — ABNORMAL LOW (ref 36.0–46.0)
Hemoglobin: 10.2 g/dL — ABNORMAL LOW (ref 12.0–15.0)
MCH: 32.4 pg (ref 26.0–34.0)
MCHC: 32 g/dL (ref 30.0–36.0)
MCV: 101.3 fL — ABNORMAL HIGH (ref 80.0–100.0)
Platelets: 534 10*3/uL — ABNORMAL HIGH (ref 150–400)
RBC: 3.15 MIL/uL — ABNORMAL LOW (ref 3.87–5.11)
RDW: 13.3 % (ref 11.5–15.5)
WBC: 6.3 10*3/uL (ref 4.0–10.5)
nRBC: 0 % (ref 0.0–0.2)

## 2020-05-31 LAB — RESPIRATORY PANEL BY RT PCR (FLU A&B, COVID)
Influenza A by PCR: NEGATIVE
Influenza B by PCR: NEGATIVE
SARS Coronavirus 2 by RT PCR: NEGATIVE

## 2020-05-31 SURGERY — INCISION AND DRAINAGE
Anesthesia: General | Laterality: Right

## 2020-05-31 MED ORDER — PROPOFOL 10 MG/ML IV BOLUS
INTRAVENOUS | Status: AC
  Filled 2020-05-31: qty 20

## 2020-05-31 MED ORDER — SODIUM CHLORIDE 0.9 % IR SOLN
Status: DC | PRN
  Administered 2020-05-31: 3000 mL

## 2020-05-31 MED ORDER — LIDOCAINE 2% (20 MG/ML) 5 ML SYRINGE
INTRAMUSCULAR | Status: DC | PRN
  Administered 2020-05-31: 80 mg via INTRAVENOUS

## 2020-05-31 MED ORDER — OXYCODONE HCL 5 MG PO TABS
5.0000 mg | ORAL_TABLET | Freq: Once | ORAL | Status: AC | PRN
  Administered 2020-05-31: 5 mg via ORAL

## 2020-05-31 MED ORDER — ORAL CARE MOUTH RINSE: 15.0000 mL | Freq: Once | OROMUCOSAL | Status: AC

## 2020-05-31 MED ORDER — MIDAZOLAM HCL 5 MG/5ML IJ SOLN
INTRAMUSCULAR | Status: DC | PRN
  Administered 2020-05-31: 2 mg via INTRAVENOUS

## 2020-05-31 MED ORDER — FENTANYL CITRATE (PF) 100 MCG/2ML IJ SOLN
INTRAMUSCULAR | Status: DC | PRN
Start: 2020-05-31 — End: 2020-05-31
  Administered 2020-05-31 (×2): 50 ug via INTRAVENOUS

## 2020-05-31 MED ORDER — FENTANYL CITRATE (PF) 100 MCG/2ML IJ SOLN
INTRAMUSCULAR | Status: AC
  Filled 2020-05-31: qty 2

## 2020-05-31 MED ORDER — FENTANYL CITRATE (PF) 100 MCG/2ML IJ SOLN
25.0000 ug | INTRAMUSCULAR | Status: DC | PRN
  Administered 2020-05-31 (×3): 50 ug via INTRAVENOUS

## 2020-05-31 MED ORDER — OXYCODONE HCL 5 MG PO TABS
ORAL_TABLET | ORAL | Status: AC
  Filled 2020-05-31: qty 1

## 2020-05-31 MED ORDER — CHLORHEXIDINE GLUCONATE 0.12 % MT SOLN
15.0000 mL | Freq: Once | OROMUCOSAL | Status: AC
  Administered 2020-05-31: 15 mL via OROMUCOSAL

## 2020-05-31 MED ORDER — LACTATED RINGERS IV SOLN: INTRAVENOUS | Status: DC

## 2020-05-31 MED ORDER — PROPOFOL 10 MG/ML IV BOLUS
INTRAVENOUS | Status: DC | PRN
  Administered 2020-05-31: 180 mg via INTRAVENOUS

## 2020-05-31 MED ORDER — CEPHALEXIN 250 MG PO CAPS: 250.0000 mg | ORAL_CAPSULE | Freq: Three times a day (TID) | ORAL | 0 refills | Status: DC

## 2020-05-31 MED ORDER — ONDANSETRON HCL 4 MG/2ML IJ SOLN
INTRAMUSCULAR | Status: DC | PRN
  Administered 2020-05-31: 4 mg via INTRAVENOUS

## 2020-05-31 MED ORDER — DEXMEDETOMIDINE HCL 200 MCG/2ML IV SOLN
INTRAVENOUS | Status: DC | PRN
  Administered 2020-05-31: 12 ug via INTRAVENOUS

## 2020-05-31 MED ORDER — OXYCODONE HCL 5 MG/5ML PO SOLN: 5.0000 mg | Freq: Once | ORAL | Status: AC | PRN

## 2020-05-31 MED ORDER — PROMETHAZINE HCL 25 MG/ML IJ SOLN: 6.2500 mg | INTRAMUSCULAR | Status: DC | PRN

## 2020-05-31 MED ORDER — MIDAZOLAM HCL 2 MG/2ML IJ SOLN
INTRAMUSCULAR | Status: AC
  Filled 2020-05-31: qty 2

## 2020-05-31 MED ORDER — DEXAMETHASONE SODIUM PHOSPHATE 10 MG/ML IJ SOLN
INTRAMUSCULAR | Status: DC | PRN
  Administered 2020-05-31: 10 mg via INTRAVENOUS

## 2020-05-31 MED ORDER — OXYCODONE HCL 5 MG PO TABS: 5.0000 mg | ORAL_TABLET | Freq: Four times a day (QID) | ORAL | 0 refills | Status: AC | PRN

## 2020-05-31 MED ORDER — POVIDONE-IODINE 10 % EX SWAB
2.0000 "application " | Freq: Once | CUTANEOUS | Status: AC
  Administered 2020-05-31: 2 via TOPICAL

## 2020-05-31 MED ORDER — ACETAMINOPHEN 10 MG/ML IV SOLN
1000.0000 mg | Freq: Four times a day (QID) | INTRAVENOUS | Status: DC
  Administered 2020-05-31: 1000 mg via INTRAVENOUS
  Filled 2020-05-31: qty 100

## 2020-05-31 MED ORDER — CHLORHEXIDINE GLUCONATE 4 % EX LIQD: 60.0000 mL | Freq: Once | CUTANEOUS | Status: DC

## 2020-05-31 MED ORDER — EPHEDRINE SULFATE-NACL 50-0.9 MG/10ML-% IV SOSY
PREFILLED_SYRINGE | INTRAVENOUS | Status: DC | PRN
  Administered 2020-05-31 (×2): 10 mg via INTRAVENOUS

## 2020-05-31 MED ORDER — DEXAMETHASONE SODIUM PHOSPHATE 10 MG/ML IJ SOLN: 8.0000 mg | Freq: Once | INTRAMUSCULAR | Status: DC

## 2020-05-31 MED ORDER — CEFAZOLIN SODIUM-DEXTROSE 2-4 GM/100ML-% IV SOLN
2.0000 g | INTRAVENOUS | Status: AC
  Administered 2020-05-31: 2 g via INTRAVENOUS
  Filled 2020-05-31: qty 100

## 2020-05-31 MED ORDER — 0.9 % SODIUM CHLORIDE (POUR BTL) OPTIME
TOPICAL | Status: DC | PRN
  Administered 2020-05-31: 1000 mL

## 2020-05-31 SURGICAL SUPPLY — 47 items
BAG SPEC THK2 15X12 ZIP CLS (MISCELLANEOUS) ×1
BAG ZIPLOCK 12X15 (MISCELLANEOUS) ×2 IMPLANT
CLSR STERI-STRIP ANTIMIC 1/2X4 (GAUZE/BANDAGES/DRESSINGS) ×1 IMPLANT
COVER SURGICAL LIGHT HANDLE (MISCELLANEOUS) ×2 IMPLANT
COVER WAND RF STERILE (DRAPES) IMPLANT
DRAPE INCISE IOBAN 66X45 STRL (DRAPES) ×2 IMPLANT
DRAPE ORTHO SPLIT 77X108 STRL (DRAPES) ×4
DRAPE SURG ORHT 6 SPLT 77X108 (DRAPES) ×2 IMPLANT
DRAPE U-SHAPE 47X51 STRL (DRAPES) ×2 IMPLANT
DRSG ADAPTIC 3X8 NADH LF (GAUZE/BANDAGES/DRESSINGS) ×2 IMPLANT
DRSG MEPILEX BORDER 4X4 (GAUZE/BANDAGES/DRESSINGS) ×2 IMPLANT
DRSG MEPILEX BORDER 4X8 (GAUZE/BANDAGES/DRESSINGS) ×2 IMPLANT
DURAPREP 26ML APPLICATOR (WOUND CARE) ×2 IMPLANT
ELECT REM PT RETURN 15FT ADLT (MISCELLANEOUS) ×2 IMPLANT
EVACUATOR 1/8 PVC DRAIN (DRAIN) ×2 IMPLANT
GAUZE SPONGE 4X4 12PLY STRL (GAUZE/BANDAGES/DRESSINGS) ×2 IMPLANT
GLOVE BIO SURGEON STRL SZ 6 (GLOVE) ×2 IMPLANT
GLOVE BIO SURGEON STRL SZ7 (GLOVE) ×2 IMPLANT
GLOVE BIO SURGEON STRL SZ8 (GLOVE) ×2 IMPLANT
GLOVE BIOGEL PI IND STRL 6.5 (GLOVE) ×1 IMPLANT
GLOVE BIOGEL PI IND STRL 7.0 (GLOVE) ×1 IMPLANT
GLOVE BIOGEL PI IND STRL 8 (GLOVE) ×1 IMPLANT
GLOVE BIOGEL PI INDICATOR 6.5 (GLOVE) ×1
GLOVE BIOGEL PI INDICATOR 7.0 (GLOVE) ×1
GLOVE BIOGEL PI INDICATOR 8 (GLOVE) ×1
GOWN STRL REUS W/TWL LRG LVL3 (GOWN DISPOSABLE) ×6 IMPLANT
HANDPIECE INTERPULSE COAX TIP (DISPOSABLE) ×2
KIT BASIN OR (CUSTOM PROCEDURE TRAY) ×2 IMPLANT
KIT TURNOVER KIT A (KITS) IMPLANT
MANIFOLD NEPTUNE II (INSTRUMENTS) ×2 IMPLANT
PACK TOTAL JOINT (CUSTOM PROCEDURE TRAY) ×2 IMPLANT
PADDING CAST COTTON 6X4 STRL (CAST SUPPLIES) ×4 IMPLANT
PENCIL SMOKE EVACUATOR (MISCELLANEOUS) IMPLANT
PROTECTOR NERVE ULNAR (MISCELLANEOUS) ×2 IMPLANT
SET HNDPC FAN SPRY TIP SCT (DISPOSABLE) ×1 IMPLANT
SPONGE GAUZE 2X2 8PLY STRL LF (GAUZE/BANDAGES/DRESSINGS) ×1 IMPLANT
STAPLER VISISTAT 35W (STAPLE) ×2 IMPLANT
SUT ETHILON 4 0 PS 2 18 (SUTURE) ×1 IMPLANT
SUT MNCRL AB 3-0 PS2 18 (SUTURE) ×1 IMPLANT
SUT MNCRL AB 4-0 PS2 18 (SUTURE) ×1 IMPLANT
SUT VIC AB 1 CT1 27 (SUTURE) ×2
SUT VIC AB 1 CT1 27XBRD ANTBC (SUTURE) ×3 IMPLANT
SUT VIC AB 2-0 CT1 27 (SUTURE) ×8
SUT VIC AB 2-0 CT1 TAPERPNT 27 (SUTURE) ×3 IMPLANT
SWAB COLLECTION DEVICE MRSA (MISCELLANEOUS) ×2 IMPLANT
SWAB CULTURE ESWAB REG 1ML (MISCELLANEOUS) ×2 IMPLANT
TOWEL OR 17X26 10 PK STRL BLUE (TOWEL DISPOSABLE) ×4 IMPLANT

## 2020-05-31 NOTE — Anesthesia Procedure Notes (Signed)
Procedure Name: LMA Insertion Performed by: Leilanie Rauda J, CRNA Pre-anesthesia Checklist: Patient identified, Emergency Drugs available, Suction available, Patient being monitored and Timeout performed Patient Re-evaluated:Patient Re-evaluated prior to induction Oxygen Delivery Method: Circle system utilized Preoxygenation: Pre-oxygenation with 100% oxygen Induction Type: IV induction Ventilation: Mask ventilation without difficulty LMA: LMA inserted LMA Size: 4.0 Number of attempts: 1 Placement Confirmation: positive ETCO2 and breath sounds checked- equal and bilateral Tube secured with: Tape Dental Injury: Teeth and Oropharynx as per pre-operative assessment        

## 2020-05-31 NOTE — H&P (Signed)
H&P  Subjective:  Chief Complaint: Hematoma, right hip  HPI: [name], 49 y.o. female is status post right total hip arthroplasty, performed on 05/09/2020. Developed a hematoma postoperatively about the incision with persistent drainage. She was last seen in the office on 05/30/2020, evaluation showed a small open area at the proximal incision with drainage. It was felt that if this continued to drain, it would put [name] at a higher risk of developing an intraarticular infection. The option of formally opening the incision up and washing the area out was presented and the patient presents today for irrigation and debridement.    Patient Active Problem List   Diagnosis Date Noted   Hematoma of right hip 05/31/2020   S/P laparoscopic appendectomy 01/05/2013   Acute appendicitis with generalized peritonitis 01/04/2013    Past Medical History:  Diagnosis Date   Anemia    after hip surgery   Anxiety    Degenerated intervertebral disc    Depression    GERD (gastroesophageal reflux disease)    Hyperlipidemia    PONV (postoperative nausea and vomiting)    Seasonal allergies     Past Surgical History:  Procedure Laterality Date   ABDOMINAL HYSTERECTOMY     LAPAROSCOPIC APPENDECTOMY N/A 01/04/2013   Procedure: APPENDECTOMY LAPAROSCOPIC;  Surgeon: Shann Medal, MD;  Location: [facility];  Service: General;  Laterality: N/A;   ROTATOR CUFF REPAIR Right    SHOULDER SURGERY Right [place] Right     Prior to Admission medications   Medication Sig Start Date End Date Taking? Authorizing Provider  acetaminophen (TYLENOL) 500 MG tablet Take 500 mg by mouth in the morning, at noon, and at bedtime.   Yes [provider]  aspirin EC 81 MG tablet Take 81 mg by mouth daily. Swallow whole.   Yes [provider]  cetirizine (ZYRTEC) 10 MG tablet Take 10 mg by mouth daily.   Yes [provider]  escitalopram (LEXAPRO) 10  MG tablet Take 10 mg by mouth daily.   Yes [provider]  estradiol (CLIMARA - DOSED IN MG/24 HR) 0.0375 mg/24hr patch Place 1 patch onto the skin 2 (two) times a week. On mondays and thursdays    Yes [provider]  ondansetron (ZOFRAN) 4 MG tablet Take 4 mg by mouth every 6 (six) hours as needed for nausea/vomiting. 05/09/20  Yes [provider]  oxyCODONE (OXY IR/ROXICODONE) 5 MG immediate release tablet Take 5-10 mg by mouth every 6 (six) hours as needed for pain. 05/15/20  Yes [provider]  pantoprazole (PROTONIX) 40 MG tablet Take 40 mg by mouth daily. 04/26/20  Yes [provider]  rosuvastatin (CRESTOR) 5 MG tablet Take 5 mg by mouth daily. 04/12/20  Yes [provider]    Allergies  Allergen Reactions   Sulfa Antibiotics Hives    Social History   Socioeconomic History   Marital status: Married    Spouse name: Not on file   Number of children: Not on file   Years of education: Not on file   Highest education level: Not on file  Occupational History   Not on file  Tobacco Use   Smoking status: Never Smoker   Smokeless tobacco: Never Used  Vaping Use   Vaping Use: Never used  Substance and Sexual Activity   Alcohol use: Yes   Drug use: No   Sexual activity: Not on file  Other Topics Concern   Not on file  Social History Narrative   Not on file   Social Determinants of Health   Financial Resource Strain:    Difficulty of Paying Living Expenses: Not on file  Food Insecurity:    Worried About Charity fundraiser in the Last Year: Not on file   [employer] of Food in the Last Year: Not on file  Transportation Needs:    Lack of Transportation (Medical): Not on file   Lack of Transportation (Non-Medical): Not on file  Physical Activity:    Days of Exercise per Week: Not on file   Minutes of Exercise per Session: Not on file  Stress:    Feeling of Stress : Not on file  Social Connections:     Frequency of Communication with Friends and Family: Not on file   Frequency of Social Gatherings with Friends and Family: Not on file   Attends Religious Services: Not on file   Active Member of Clubs or Organizations: Not on file   Attends Archivist Meetings: Not on file   Marital Status: Not on file  Intimate Partner Violence:    Fear of Current or Ex-Partner: Not on file   Emotionally Abused: Not on file   Physically Abused: Not on file   Sexually Abused: Not on file      Tobacco Use: Low Risk    Smoking Tobacco Use: Never Smoker   Smokeless Tobacco Use: Never Used   Social History   Substance and Sexual Activity  Alcohol Use Yes    History reviewed. No pertinent family history.  Review of Systems  Constitutional: Negative for chills and fever.  HENT: Negative for congestion, sore throat and tinnitus.   Eyes: Negative for double vision, photophobia and pain.  Respiratory: Negative for cough, shortness of breath and wheezing.   Cardiovascular: Negative for chest pain, palpitations and orthopnea.  Gastrointestinal: Negative for heartburn, nausea and vomiting.  Genitourinary: Negative for dysuria, frequency and urgency.  Musculoskeletal: Positive for joint pain.  Neurological: Negative for dizziness, weakness and headaches.     Objective:  Physical Exam: The patient is a pleasant, well-developed female alert and oriented in no apparent distress.    The patient has a minimally antalgic gait and ambulates with a cane.    Right hip exam:  Her incision has no erythema or exudate. She has had a tiny opening superiorly. I can not express any fluid today, but she just changed her bandage and she has been saturating a bandage twice per day at home.    The patient's sensation and motor function is intact in their lower extremities. Their distal pulses are 2+. The bilateral calves are soft and nontender.  Vital signs in last 24 hours: Temp:  [98.1 F  (36.7 C)] 98.1 F (36.7 C) (10/13 1251) Pulse Rate:  [59] 59 (10/13 1251) Resp:  [16] 16 (10/13 1251) BP: (150)/(95) 150/95 (10/13 1251) SpO2:  [100 %] 100 % (10/13 1251) Weight:  [86.2 kg] 86.2 kg (10/13 1251)  Assessment/Plan:  Hematoma, right hip  Plan for irrigation and debridement of right hip hematoma. Patient will discharge later today. Risks and benefits of the procedure were discussed at length with the patient and she wishes to proceed.   Theresa Duty, PA-C Orthopedic Surgery [facility] [place] [facility]

## 2020-05-31 NOTE — Discharge Instructions (Signed)
Gaynelle Arabian, MD [facility] [address] [place], [place] [address] [phone] POSTOPERATIVE DIRECTIONS   HOME CARE INSTRUCTIONS   ICE to the affected hip as frequently as 20-30 minutes an hour and then as needed for pain and swelling. Continue to use ice on the hip for pain and swelling from surgery. You may notice swelling that will progress down to the foot and ankle. This is normal after surgery. Elevate the leg when you are not up walking on it.    DIET You may resume your previous home diet once your are discharged from the hospital.  DRESSING / WOUND CARE / SHOWERING  You have an adhesive waterproof bandage over the incision. Leave this in place until your first follow-up appointment. Once you remove this you will not need to place another bandage.   You may begin showering 3 days following surgery, but do not submerge the incision under water.  WEIGHT BEARING Weight bearing as tolerated with assist device (walker, cane, etc) as directed, use it as long as suggested by your surgeon or therapist, typically at least 4-6 weeks.  POSTOPERATIVE CONSTIPATION PROTOCOL Constipation - defined medically as fewer than three stools per week and severe constipation as less than one stool per week.  One of the most common issues patients have following surgery is constipation.  Even if you have a regular bowel pattern at home, your normal regimen is likely to be disrupted due to multiple reasons following surgery.  Combination of anesthesia, postoperative narcotics, change in appetite and fluid intake all can affect your bowels.  In order to avoid complications following surgery, here are some recommendations in order to help you during your recovery period.   Colace (docusate) - Pick up an over-the-counter form of Colace or another stool softener and take twice a day as long as you are requiring postoperative pain medications.  Take  with a full glass of water daily.  If you experience loose stools or diarrhea, hold the colace until you stool forms back up.  If your symptoms do not get better within 1 week or if they get worse, check with your doctor.  Dulcolax (bisacodyl) - Pick up over-the-counter and take as directed by the product packaging as needed to assist with the movement of your bowels.  Take with a full glass of water.  Use this product as needed if not relieved by Colace only.   MiraLax (polyethylene glycol) - Pick up over-the-counter to have on hand.  MiraLax is a solution that will increase the amount of water in your bowels to assist with bowel movements.  Take as directed and can mix with a glass of water, juice, soda, coffee, or tea.  Take if you go more than two days without a movement.Do not use MiraLax more than once per day. Call your doctor if you are still constipated or irregular after using this medication for 7 days in a row.  If you continue to have problems with postoperative constipation, please contact the office for further assistance and recommendations.  If you experience "the worst abdominal pain ever" or develop nausea or vomiting, please contact the office immediatly for further recommendations for treatment.  ITCHING  If you experience itching with your medications, try taking only a single pain pill, or even half a pain pill at a time.  You can also use Benadryl over the counter for itching or also to help with sleep.   MEDICATIONS See your medication  summary on the After Visit Summary that the nursing staff will review with you prior to discharge.  You may have some home medications which will be placed on hold until you complete the course of blood thinner medication.  It is important for you to complete the blood thinner medication as prescribed by your surgeon.  Continue your approved medications as instructed at time of discharge.  PRECAUTIONS If you experience chest pain or shortness  of breath - call 911 immediately for transfer to the hospital emergency department.  If you develop a fever greater that 101 F, purulent drainage from wound, increased redness or drainage from wound, foul odor from the wound/dressing, or calf pain - CONTACT YOUR SURGEON.                                                   FOLLOW-UP APPOINTMENTS Make sure you keep all of your appointments after your operation with your surgeon and caregivers. You should call the office at the above phone number and make an appointment for approximately two weeks after the date of your surgery or on the date instructed by your surgeon outlined in the "After Visit Summary".   MAKE SURE YOU:   Understand these instructions.   Get help right away if you are not doing well or get worse.    Pick up stool softner and laxative for home use following surgery while on pain medications. Do not submerge incision under water. Please use good hand washing techniques while changing dressing each day. May shower starting three days after surgery. Please use a clean towel to pat the incision dry following showers. Continue to use ice for pain and swelling after surgery. Do not use any lotions or creams on the incision until instructed by your surgeon.

## 2020-05-31 NOTE — Anesthesia Preprocedure Evaluation (Addendum)
Anesthesia Evaluation  Patient identified by MRN, date of birth, ID band Patient awake    Reviewed: Allergy & Precautions, NPO status , Patient's Chart, lab work & pertinent test results  History of Anesthesia Complications (+) PONV and history of anesthetic complications  Airway Mallampati: II  TM Distance: >3 FB Neck ROM: Full    Dental  (+) Dental Advisory Given, Teeth Intact   Pulmonary neg pulmonary ROS,    Pulmonary exam normal        Cardiovascular negative cardio ROS Normal cardiovascular exam     Neuro/Psych PSYCHIATRIC DISORDERS Anxiety Depression negative neurological ROS     GI/Hepatic Neg liver ROS, GERD  Medicated and Controlled,  Endo/Other   Obesity   Renal/GU negative Renal ROS     Musculoskeletal  (+) Arthritis ,   Abdominal   Peds  Hematology negative hematology ROS (+)   Anesthesia Other Findings Covid test negative   Reproductive/Obstetrics                           Anesthesia Physical Anesthesia Plan  ASA: II  Anesthesia Plan: General   Post-op Pain Management:    Induction: Intravenous  PONV Risk Score and Plan: 4 or greater and Treatment may vary due to age or medical condition, Ondansetron, Midazolam and Dexamethasone  Airway Management Planned: LMA  Additional Equipment: None  Intra-op Plan:   Post-operative Plan: Extubation in OR  Informed Consent: I have reviewed the patients History and Physical, chart, labs and discussed the procedure including the risks, benefits and alternatives for the proposed anesthesia with the patient or authorized representative who has indicated his/her understanding and acceptance.     Dental advisory given  Plan Discussed with: CRNA and Anesthesiologist  Anesthesia Plan Comments:        Anesthesia Quick Evaluation

## 2020-05-31 NOTE — Interval H&P Note (Signed)
History and Physical Interval Note:  05/31/2020 1:49 PM  [name]  has presented today for surgery, with the diagnosis of Right hip hematoma.  The various methods of treatment have been discussed with the patient and family. After consideration of risks, benefits and other options for treatment, the patient has consented to  Procedure(s) with comments: INCISION AND DRAINAGE RIGHT HIP HEMATOMA (Right) - 55min as a surgical intervention.  The patient's history has been reviewed, patient examined, no change in status, stable for surgery.  I have reviewed the patient's chart and labs.  Questions were answered to the patient's satisfaction.     Pilar Plate Everette Dimauro

## 2020-05-31 NOTE — Transfer of Care (Signed)
Immediate Anesthesia Transfer of Care Note  Patient: [name]  Procedure(s) Performed: INCISION AND DRAINAGE RIGHT HIP HEMATOMA (Right )  Patient Location: PACU  Anesthesia Type:General  Level of Consciousness: awake, alert  and oriented  Airway & Oxygen Therapy: Patient Spontanous Breathing and Patient connected to face mask oxygen  Post-op Assessment: Report given to RN and Post -op Vital signs reviewed and stable  Post vital signs: Reviewed and stable  Last Vitals:  Vitals Value Taken Time  BP 148/95 05/31/20 1615  Temp    Pulse 91 05/31/20 1617  Resp 20 05/31/20 1618  SpO2 100 % 05/31/20 1617  Vitals shown include unvalidated device data.  Last Pain:  Vitals:   05/31/20 1354  TempSrc:   PainSc: 3       Patients Stated Pain Goal: 3 (99/35/70 1779)  Complications: No complications documented.

## 2020-05-31 NOTE — Anesthesia Postprocedure Evaluation (Signed)
Anesthesia Post Note  Patient: [name]  Procedure(s) Performed: INCISION AND DRAINAGE RIGHT HIP HEMATOMA (Right )     Patient location during evaluation: PACU Anesthesia Type: General Level of consciousness: awake and alert Pain management: pain level controlled Vital Signs Assessment: post-procedure vital signs reviewed and stable Respiratory status: spontaneous breathing, nonlabored ventilation and respiratory function stable Cardiovascular status: blood pressure returned to baseline and stable Postop Assessment: no apparent nausea or vomiting Anesthetic complications: no   No complications documented.  Last Vitals:  Vitals:   05/31/20 1700 05/31/20 1719  BP: (!) 118/104 118/76  Pulse: 64 66  Resp: 14 15  Temp:  36.6 C  SpO2: 93% 97%    Last Pain:  Vitals:   05/31/20 1719  TempSrc:   PainSc: [place]

## 2020-05-31 NOTE — Op Note (Signed)
NAME: [name], [name] MEDICAL RECORD [id number] ACCOUNT [id number] DATE OF BIRTH:Nov 16, 1970 FACILITY: [facility] LOCATION: [facility] PHYSICIAN:Saladin Petrelli Zella Ball, MD  OPERATIVE REPORT  DATE OF PROCEDURE:  05/31/2020  PREOPERATIVE DIAGNOSIS:  Right hip hematoma.  POSTOPERATIVE DIAGNOSIS:  Right hip hematoma.  PROCEDURE:  Irrigation and debridement, right hip hematoma.  SURGEON:  Gaynelle Arabian, MD  ASSISTANT:  Theresa Duty, PA-C  ANESTHESIA:  General.  ESTIMATED BLOOD LOSS:  50 mL.  DRAINS:  Hemovac x1.  COMPLICATIONS:  None.  CONDITION:  Stable to recovery.  BRIEF CLINICAL NOTE:  The patient is a 49 year old female who had a right total hip arthroplasty done approximately 3 weeks ago.  She has been doing well from a pain standpoint, functional standpoint, but last week developed drainage of the hematoma from  the incision.  She has had persistent drainage.  Thus, it was felt that it would be best to do an irrigation and debridement.  She presents today for that.  PROCEDURE IN DETAIL:  After successful administration of general anesthetic, the patient was placed on the Hana bed with her feet in well-padded boots and legs in the traction device, but no traction was applied.  Right lower extremity was isolated from  her perineum with plastic drapes and then prepped and draped in the usual sterile fashion.  She had a 5 x 5 mm open area at the superior part of the incision.  We used our old incision, cut the skin with a 10 blade through the subcutaneous tissue.  I  sent fluid cultures for Gram stain, culture and sensitivity, aerobic and anaerobic cultures.  No purulence was identified.  Everything was isolated through the subcutaneous tissues.  We incised, but did not need to remove any tissue.  She did have fairly  friable tissue at the base of the fascia and it was oozing, thus had to achieve meticulous hemostasis.  I irrigated the subcutaneous area with about a liter of saline  using pulsatile lavage.  I made a tiny incision in the fascia with a fresh blade and  we did not encounter any fluid in that the subfascial region.  I washed it with another 500 of saline and then immediately closed it with interrupted #1 Vicryl.  We further irrigated the subcutaneous space with about another liter of saline.  I placed a  Hemovac drain and sewed the Hemovac drain in at the level of the skin with 3-0 nylon.  We closed the subcutaneous space in 2 layers, one deep layer with 2-0, one superficial layer with 2-0 and then a subcuticular stitch on the skin.  Incisions cleaned  and dried and Steri-Strips and sterile dressing applied.  The drain was sewn in with the 3-0 nylon.  The patient was subsequently awakened and transported to recovery in stable condition.  VN/NUANCE  D:05/31/2020 T:05/31/2020 JOB:[id number]

## 2020-05-31 NOTE — Brief Op Note (Signed)
05/31/2020  3:46 PM  PATIENT:  Darin Engels  49 y.o. female  PRE-OPERATIVE DIAGNOSIS:  Right hip hematoma  POST-OPERATIVE DIAGNOSIS:  Right hip hematoma  PROCEDURE:  Procedure(s) with comments: INCISION AND DRAINAGE RIGHT HIP HEMATOMA (Right) - 34min  SURGEON:  Surgeon(s) and Role:    Gaynelle Arabian, MD - Primary  PHYSICIAN ASSISTANT:   ASSISTANTS: Theresa Duty, PA-C   ANESTHESIA:   general  EBL:  50 mL   BLOOD ADMINISTERED:none  DRAINS: (Medium) Hemovact drain(s) in the right hip with  Suction Open   LOCAL MEDICATIONS USED:  NONE  COUNTS:  YES  TOURNIQUET:  * No tourniquets in log *  DICTATION: .Other Dictation: Dictation Number [id number]  PLAN OF CARE: Discharge to home after PACU  PATIENT DISPOSITION:  PACU - hemodynamically stable.

## 2020-06-01 ENCOUNTER — Encounter (HOSPITAL_COMMUNITY): Payer: Self-pay | Admitting: Orthopedic Surgery

## 2020-06-05 LAB — AEROBIC/ANAEROBIC CULTURE W GRAM STAIN (SURGICAL/DEEP WOUND)

## 2020-06-08 ENCOUNTER — Other Ambulatory Visit: Payer: Self-pay

## 2020-06-08 ENCOUNTER — Ambulatory Visit (INDEPENDENT_AMBULATORY_CARE_PROVIDER_SITE_OTHER): Payer: BC Managed Care – PPO | Admitting: Internal Medicine

## 2020-06-08 ENCOUNTER — Encounter: Payer: Self-pay | Admitting: Internal Medicine

## 2020-06-08 VITALS — BP 137/86 | HR 63 | Wt 191.0 lb

## 2020-06-08 DIAGNOSIS — S7001XA Contusion of right hip, initial encounter: Secondary | ICD-10-CM

## 2020-06-08 DIAGNOSIS — Y838 Other surgical procedures as the cause of abnormal reaction of the patient, or of later complication, without mention of misadventure at the time of the procedure: Secondary | ICD-10-CM

## 2020-06-08 DIAGNOSIS — L7632 Postprocedural hematoma of skin and subcutaneous tissue following other procedure: Secondary | ICD-10-CM

## 2020-06-08 NOTE — Progress Notes (Signed)
[facility]      Reason for Consult: wound infection after surgery    Referring Physician: Dr. Joylene Draft    Patient ID: [name], female    DOB: [birth year], 49 y.o.   MRN: [id number]  HPI:   She is here for evaluation of her right hip following prosthetic hip replacement.  She underwent the initial prosthetic hip replacement in September and then developed serosanguinous drainage from the incision site.  She went back to the OR by Dr. Maureen Ralphs on 10/13 and underwent I and D and he noted a right hip hematoma superfically.  No issues in the joint itself.  She was then started on Keflex until growth from the I and D grew out Morganella morganii and was changed to cipro in discussion with my partner with Dr. Joylene Draft.  Since the debridement she has noted less pain, better movement and less drainage.  No pus has been noted.    Past Medical History:  Diagnosis Date  . Anemia    after hip surgery  . Anxiety   . Degenerated intervertebral disc   . Depression   . GERD (gastroesophageal reflux disease)   . Hyperlipidemia   . PONV (postoperative nausea and vomiting)   . Seasonal allergies     Prior to Admission medications   Medication Sig Start Date End Date Taking? Authorizing Provider  acetaminophen (TYLENOL) 500 MG tablet Take 500 mg by mouth in the morning, at noon, and at bedtime.   Yes [provider]  aspirin EC 81 MG tablet Take 81 mg by mouth daily. Swallow whole.   Yes [provider]  cetirizine (ZYRTEC) 10 MG tablet Take 10 mg by mouth daily.   Yes [provider]  ciprofloxacin (CIPRO) 500 MG tablet SMARTSIG:1 Tablet(s) By Mouth Every 12 Hours 06/02/20  Yes [provider]  escitalopram (LEXAPRO) 10 MG tablet Take 10 mg by mouth daily.   Yes [provider]  estradiol (CLIMARA - DOSED IN MG/24 HR) 0.0375 mg/24hr patch Place 1 patch onto the skin 2 (two) times a week. On mondays and thursdays    Yes  [provider]  pantoprazole (PROTONIX) 40 MG tablet Take 40 mg by mouth daily. 04/26/20  Yes [provider]  rosuvastatin (CRESTOR) 5 MG tablet Take 5 mg by mouth daily. 04/12/20  Yes [provider]  ondansetron (ZOFRAN) 4 MG tablet Take 4 mg by mouth every 6 (six) hours as needed for nausea/vomiting. Patient not taking: Reported on 06/08/2020 05/09/20   [provider]  oxyCODONE (OXY IR/ROXICODONE) 5 MG immediate release tablet Take 5-10 mg by mouth every 6 (six) hours as needed for pain. Patient not taking: Reported on 06/08/2020 05/15/20   [provider]  oxyCODONE (ROXICODONE) 5 MG immediate release tablet Take 1-2 tablets (5-10 mg total) by mouth every 6 (six) hours as needed. Patient not taking: Reported on 06/08/2020 05/31/20 05/31/21  Derl Barrow, PA    Allergies  Allergen Reactions  . Sulfa Antibiotics Hives    Social History   Tobacco Use  . Smoking status: Never Smoker  . Smokeless tobacco: Never Used  Vaping Use  . Vaping Use: Never used  Substance Use Topics  . Alcohol use: Yes    Alcohol/week: 3.0 standard drinks    Types: 3 Glasses of wine per week  . Drug use: No    FMH: + cardiac disease  Review of Systems  Constitutional: negative for fevers, chills and  sweats Gastrointestinal: negative for nausea and diarrhea Integument/breast: negative for rash All other systems reviewed and are negative    Constitutional: in no apparent distress  Vitals:   06/08/20 0848  BP: 137/86  Pulse: 63  SpO2: 100%   EYES: anicteric Respiratory: normal respiratory effort Musculoskeletal: no edema: incision area nearly closed, some mild serosanguinous drainage, no erythema, no tenderness or warmth Skin: negatives: no rash Neuro: non-focal  Labs: Lab Results  Component Value Date   WBC 6.3 05/31/2020   HGB 10.2 (L) 05/31/2020   HCT 31.9 (L) 05/31/2020   MCV 101.3 (H) 05/31/2020   PLT 534 (H) 05/31/2020    Lab  Results  Component Value Date   CREATININE 0.71 01/04/2013   BUN 7 01/04/2013   NA 137 01/04/2013   K 4.6 01/04/2013   CL 100 01/04/2013   CO2 27 01/04/2013    Lab Results  Component Value Date   ALT 27 03/21/2012   AST 29 03/21/2012   ALKPHOS 53 03/21/2012   BILITOT 0.5 03/21/2012     Assessment: superficial wound infection.  Noted in the OR not to be deep and with hematoma with culture growth.  I would treat this for 2 weeks of which she has completed one week.  Cipro is the appropriate agent based on sensitivities.    Plan: 1) cipro for 14 days total She has follow up with Dr. Maureen Ralphs and will call/follow up with me again if there are significant changes/concerns.

## 2020-06-20 ENCOUNTER — Other Ambulatory Visit (HOSPITAL_COMMUNITY): Payer: Self-pay | Admitting: Orthopedic Surgery

## 2020-06-20 ENCOUNTER — Other Ambulatory Visit: Payer: Self-pay

## 2020-06-20 ENCOUNTER — Ambulatory Visit (HOSPITAL_COMMUNITY)
Admission: RE | Admit: 2020-06-20 | Discharge: 2020-06-20 | Disposition: A | Discharge status: Home / Self Care | Payer: BC Managed Care – PPO | Source: Ambulatory Visit | Attending: Orthopedic Surgery | Admitting: Orthopedic Surgery

## 2020-06-20 DIAGNOSIS — R52 Pain, unspecified: Secondary | ICD-10-CM

## 2021-11-16 ENCOUNTER — Other Ambulatory Visit: Payer: Self-pay | Admitting: Internal Medicine

## 2021-11-16 DIAGNOSIS — E785 Hyperlipidemia, unspecified: Secondary | ICD-10-CM

## 2021-12-18 ENCOUNTER — Ambulatory Visit
Admission: RE | Admit: 2021-12-18 | Discharge: 2021-12-18 | Disposition: A | Discharge status: Home / Self Care | Payer: No Typology Code available for payment source | Source: Ambulatory Visit | Attending: Internal Medicine | Admitting: Internal Medicine

## 2021-12-18 DIAGNOSIS — E785 Hyperlipidemia, unspecified: Secondary | ICD-10-CM

## 2022-02-21 ENCOUNTER — Other Ambulatory Visit: Payer: Self-pay | Admitting: Internal Medicine

## 2022-02-21 DIAGNOSIS — Z1231 Encounter for screening mammogram for malignant neoplasm of breast: Secondary | ICD-10-CM

## 2022-03-05 ENCOUNTER — Ambulatory Visit
Admission: RE | Admit: 2022-03-05 | Discharge: 2022-03-05 | Disposition: A | Discharge status: Home / Self Care | Payer: BC Managed Care – PPO | Source: Ambulatory Visit | Attending: Internal Medicine | Admitting: Internal Medicine

## 2022-03-05 DIAGNOSIS — Z1231 Encounter for screening mammogram for malignant neoplasm of breast: Secondary | ICD-10-CM

## 2022-05-07 ENCOUNTER — Ambulatory Visit (INDEPENDENT_AMBULATORY_CARE_PROVIDER_SITE_OTHER): Payer: BC Managed Care – PPO | Admitting: Neurology

## 2022-05-07 ENCOUNTER — Encounter: Payer: Self-pay | Admitting: Neurology

## 2022-05-07 VITALS — BP 145/107 | HR 71 | Ht 60.0 in | Wt 194.6 lb

## 2022-05-07 DIAGNOSIS — R0683 Snoring: Secondary | ICD-10-CM

## 2022-05-07 DIAGNOSIS — E6609 Other obesity due to excess calories: Secondary | ICD-10-CM | POA: Diagnosis not present

## 2022-05-07 DIAGNOSIS — R0981 Nasal congestion: Secondary | ICD-10-CM | POA: Diagnosis not present

## 2022-05-07 NOTE — Addendum Note (Signed)
Addended by: Larey Seat on: 05/07/2022 10:51 AM   Modules accepted: Orders

## 2022-05-07 NOTE — Progress Notes (Signed)
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 Joylene Draft, MD for a sleep evaluation.   Chief concern according to patient : "I sleep well but I snore, BMI has been stable, I exercise a lot"       [name]  has a past medical history of Anemia, Anxiety, Degenerated intervertebral disc, Depression, GERD (gastroesophageal reflux disease), Hyperlipidemia, PONV (postoperative nausea and vomiting), and Seasonal allergies.   Sleep relevant medical history: Sleep walking in childhood, no ENT surgeries.   Family medical /sleep history: father with OSA,  CAD,Alzheimer's Disease, passed in 2022 at age 34. Former professor at [employer].  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 . Family status is married . The patient currently works daytime,  no night shift in the recent past.   Tobacco use; former- quit 12 years ago. Marland Kitchen  ETOH use : socially,  6-7/ week.  Caffeine intake in form of Coffee( in AM ) Soda( /) Tea ( /) or energy drinks. Regular exercise : peleton, group exercises 3 times a week.   Hobbies :Yoga      Sleep habits are as follows: The patient's dinner time is between 7.30-9.30 PM. The patient goes to bed at 10 and 12 PM and continues to sleep for 6-8 hours, wakes for one bathroom break, .   The preferred sleep position is prone , with the support of 1 pillow.  Dreams are reportedly frequent. Sleep talking.  6.30  AM is the usual rise time. The patient wakes up  spontaneously. She reports usually  feeling refreshed or restored in AM, with symptoms such as dry mouth, no morning headaches, and residual fatigue.  Naps are not taken I.   Review of Systems: Out of a complete 14 system review, the patient complains of only the following symptoms, and all other reviewed systems are negative.:  , snoring   How likely are you to doze in the following situations: 0 = not likely, 1 = slight chance, 2 = moderate chance, 3 = high chance   Sitting and Reading? Watching Television? Sitting inactive in a public place (theater or meeting)? As a passenger in a car for an hour without a break? Lying down in the afternoon when circumstances permit? Sitting and talking to someone? Sitting quietly after lunch without alcohol? In a car, while stopped for a few minutes in traffic?   Total = 4/ 24 points   FSS endorsed at 12/ 63 points.   Postnasal drip, sinus issues, deviated septum.  Mother witnessed snoring and apnea.   GERD rarely-   Social History   Socioeconomic History   Marital status: Married    Spouse name: Not on file   Number of children: Not on file   Years of education: Not on file   Highest education level: Not on file  Occupational History  Not on file  Tobacco Use   Smoking status: Never   Smokeless tobacco: Never  Vaping Use   Vaping Use: Never used  Substance and Sexual Activity   Alcohol use: Yes    Alcohol/week: 3.0 standard drinks of alcohol    Types: 3 Glasses of wine per week   Drug use: No   Sexual activity: Not on file  Other Topics Concern   Not on file  Social History Narrative   Not on file   Social Determinants of Health   Financial Resource Strain: Not on file  Food Insecurity: Not on file  Transportation Needs: Not on file  Physical Activity: Not on file  Stress: Not on file  Social Connections: Not on file    History reviewed. No pertinent family history.  Past Medical History:  Diagnosis Date    Anemia    after hip surgery   Anxiety    Degenerated intervertebral disc    Depression    GERD (gastroesophageal reflux disease)    Hyperlipidemia    PONV (postoperative nausea and vomiting)    Seasonal allergies     Past Surgical History:  Procedure Laterality Date   ABDOMINAL HYSTERECTOMY     INCISION AND DRAINAGE Right 05/31/2020   Procedure: INCISION AND DRAINAGE RIGHT HIP HEMATOMA;  Surgeon: Gaynelle Arabian, MD;  Location: [facility];  Service: Orthopedics;  Laterality: Right;  16mn   LAPAROSCOPIC APPENDECTOMY N/A 01/04/2013   Procedure: APPENDECTOMY LAPAROSCOPIC;  Surgeon: DShann Medal MD;  Location: [facility];  Service: General;  Laterality: N/A;   ROTATOR CUFF REPAIR Right    SHOULDER SURGERY Right 1990   TOTAL HIP ARTHROPLASTY Right      Current Outpatient Medications on File Prior to Visit  Medication Sig Dispense Refill   escitalopram (LEXAPRO) 10 MG tablet Take 10 mg by mouth daily.     estradiol (CLIMARA - DOSED IN MG/24 HR) 0.0375 mg/24hr patch Place 1 patch onto the skin 2 (two) times a week. On mondays and thursdays      pantoprazole (PROTONIX) 40 MG tablet Take 40 mg by mouth as needed (As needed).     acetaminophen (TYLENOL) 500 MG tablet Take 500 mg by mouth in the morning, at noon, and at bedtime. (Patient not taking: Reported on 05/07/2022)     aspirin EC 81 MG tablet Take 81 mg by mouth daily. Swallow whole. (Patient not taking: Reported on 05/07/2022)     cetirizine (ZYRTEC) 10 MG tablet Take 10 mg by mouth daily. (Patient not taking: Reported on 05/07/2022)     ciprofloxacin (CIPRO) 500 MG tablet SMARTSIG:1 Tablet(s) By Mouth Every 12 Hours (Patient not taking: Reported on 05/07/2022)     ondansetron (ZOFRAN) 4 MG tablet Take 4 mg by mouth every 6 (six) hours as needed for nausea/vomiting. (Patient not taking: Reported on 06/08/2020)     oxyCODONE (OXY IR/ROXICODONE) 5 MG immediate release tablet Take 5-10 mg by mouth every 6 (six) hours as needed for pain. (Patient  not taking: Reported on 06/08/2020)     rosuvastatin (CRESTOR) 5 MG tablet Take 5 mg by mouth daily. (Patient not taking: Reported on 05/07/2022)     No current facility-administered medications on file prior to visit.    Allergies  Allergen Reactions   Sulfa Antibiotics Hives    Physical exam:  Today's Vitals   05/07/22 0958  BP: (!) 145/107  Pulse: 71  Weight: 194 lb 9.6 oz (88.3 kg)  Height: 5' (1.524 m)   Body  mass index is 38.01 kg/m.   Wt Readings from Last 3 Encounters:  05/07/22 194 lb 9.6 oz (88.3 kg)  06/08/20 191 lb (86.6 kg)  05/31/20 190 lb (86.2 kg)     Ht Readings from Last 3 Encounters:  05/07/22 5' (1.524 m)  05/31/20 '4\' 11"'$  (1.499 m)      General: The patient is awake, alert and appears not in acute distress. The patient is well groomed. Head: Normocephalic, atraumatic. Neck is supple.  Mallampati 3,  neck circumference:14.25 inches .  Nasal airflow restricted -  Retrognathia is not  seen.  Dental status: intact- small oral opening.  Cardiovascular:  Regular rate and cardiac rhythm by pulse,  without distended neck veins. Respiratory: Lungs are clear to auscultation.  Skin:  Without evidence of ankle edema, or rash. Trunk: The patient's posture is erect.   Neurologic exam : The patient is awake and alert, oriented to place and time.   Memory subjective described as intact.  Attention span & concentration ability appears normal.  Speech is fluent,  without  dysarthria, dysphonia or aphasia.  Mood and affect are appropriate.   Cranial nerves: no loss of smell or taste reported  Pupils are equal and briskly reactive to light. Funduscopic exam deferred.  Extraocular movements in vertical and horizontal planes were intact and without nystagmus. No Diplopia. Visual fields by finger perimetry are intact. Hearing was intact to soft voice and finger rubbing.    Facial sensation intact to fine touch.  Facial motor strength is symmetric and tongue and  uvula move midline.  Neck ROM : rotation, tilt and flexion extension were normal for age and shoulder shrug was symmetrical.    Motor exam:  Symmetric bulk, tone and ROM.   Normal tone without cog-wheeling, symmetric grip strength .   Sensory:  Fine touch and vibration were tested  and  normal.  Right big toe  with dysesthesias, L5-S1 dermatome.  Proprioception tested in the upper extremities was normal.   Coordination: Rapid alternating movements in the fingers/hands were of normal speed.  The Finger-to-nose maneuver was intact without evidence of ataxia, dysmetria or tremor.   Gait and station: Patient could rise unassisted from a seated position, walked without assistive device.  Stance is of normal width/ base .  Toe and heel walk were deferred.  Deep tendon reflexes: in the  upper extremities are symmetric and intact. Lower  extremities with very brisk patella reflex on the left, right hip replacement.  Babinski response was deferred.        After spending a total time of  35  minutes face to face and additional time for physical and neurologic examination, review of laboratory studies,  personal review of imaging studies, reports and results of other testing and review of referral information / records as far as provided in visit, I have established the following assessments:  1)  snoring and postnasal drip, hoarseness.  2)  BMI elevated 38 3)  nasal airflow improves under breathe rite strips,    My Plan is to proceed with:  1)saline nasal spray, allegra as needed.  2) breath rite strips.  3) HST to rule out/ in OSA.   I would like to thank Crist Infante, MD and Crist Infante, [place] [place],  [place] [address] for allowing me to meet with and to take care of this pleasant patient.   I plan to follow up either personally or through our NP within 2-4 months.   CC: I  will share my notes with Dr. Joylene Draft.  Electronically signed by: Larey Seat, MD 05/07/2022 10:11  AM  [facility] Board certified by The [employer] of Sleep Medicine and Diplomate of the [employer] of Sleep Medicine. Board certified In Neurology through the [place], Fellow of the [employer] of Neurology. Medical Director of [facility].

## 2022-05-07 NOTE — Patient Instructions (Signed)
Quality Sleep Information, Adult Quality sleep is important for your mental and physical health. It also improves your quality of life. Quality sleep means you: Are asleep for most of the time you are in bed. Fall asleep within 30 minutes. Wake up no more than once a night. Are awake for no longer than 20 minutes if you do wake up during the night. Most adults need 7-8 hours of quality sleep each night. How can poor sleep affect me? If you do not get enough quality sleep, you may have: Mood swings. Daytime sleepiness. Decreased alertness, reaction time, and concentration. Sleep disorders, such as insomnia and sleep apnea. Difficulty with: Solving problems. Coping with stress. Paying attention. These issues may affect your performance and productivity at work, school, and home. Lack of sleep may also put you at higher risk for accidents, suicide, and risky behaviors. If you do not get quality sleep, you may also be at higher risk for several health problems, including: Infections. Type 2 diabetes. Heart disease. High blood pressure. Obesity. Worsening of long-term conditions, like arthritis, kidney disease, depression, Parkinson's disease, and epilepsy. What actions can I take to get more quality sleep? Sleep schedule and routine Stick to a sleep schedule. Go to sleep and wake up at about the same time each day. Do not try to sleep less on weekdays and make up for lost sleep on weekends. This does not work. Limit naps during the day to 30 minutes or less. Do not take naps in the late afternoon. Make time to relax before bed. Reading, listening to music, or taking a hot bath promotes quality sleep. Make your bedroom a place that promotes quality sleep. Keep your bedroom dark, quiet, and at a comfortable room temperature. Make sure your bed is comfortable. Avoid using electronic devices that give off bright blue light for 30 minutes before bedtime. Your brain perceives bright blue light  as sunlight. This includes television, phones, and computers. If you are lying awake in bed for longer than 20 minutes, get up and do a relaxing activity until you feel sleepy. Lifestyle     Try to get at least 30 minutes of exercise on most days. Do not exercise 2-3 hours before going to bed. Do not use any products that contain nicotine or tobacco. These products include cigarettes, chewing tobacco, and vaping devices, such as e-cigarettes. If you need help quitting, ask your health care provider. Do not drink caffeinated beverages for at least 8 hours before going to bed. Coffee, tea, and some sodas contain caffeine. Do not drink alcohol or eat large meals close to bedtime. Try to get at least 30 minutes of sunlight every day. Morning sunlight is best. Medical concerns Work with your health care provider to treat medical conditions that may affect sleeping, such as: Nasal obstruction. Snoring. Sleep apnea and other sleep disorders. Talk to your health care provider if you think any of your prescription medicines may cause you to have difficulty falling or staying asleep. If you have sleep problems, talk with a sleep consultant. If you think you have a sleep disorder, talk with your health care provider about getting evaluated by a specialist. Where to find more information Sleep Foundation: sleepfoundation.org American Academy of Sleep Medicine: aasm.org Centers for Disease Control and Prevention (CDC): StoreMirror.com.cy Contact a health care provider if: You have trouble getting to sleep or staying asleep. You often wake up very early in the morning and cannot get back to sleep. You have daytime sleepiness. You  have daytime sleep attacks of suddenly falling asleep and sudden muscle weakness (narcolepsy). You have a tingling sensation in your legs with a strong urge to move your legs (restless legs syndrome). You stop breathing briefly during sleep (sleep apnea). You think you have a sleep  disorder or are taking a medicine that is affecting your quality of sleep. Summary Most adults need 7-8 hours of quality sleep each night. Getting enough quality sleep is important for your mental and physical health. Make your bedroom a place that promotes quality sleep, and avoid things that may cause you to have poor sleep, such as alcohol, caffeine, smoking, or large meals. Talk to your health care provider if you have trouble falling asleep or staying asleep. This information is not intended to replace advice given to you by your health care provider. Make sure you discuss any questions you have with your health care provider. Document Revised: 11/28/2021 Document Reviewed: 11/28/2021 Elsevier Patient Education  2023 Elsevier Inc.  

## 2022-05-22 ENCOUNTER — Ambulatory Visit: Payer: BC Managed Care – PPO | Admitting: Neurology

## 2022-05-22 DIAGNOSIS — G4733 Obstructive sleep apnea (adult) (pediatric): Secondary | ICD-10-CM | POA: Diagnosis not present

## 2022-05-22 DIAGNOSIS — R0981 Nasal congestion: Secondary | ICD-10-CM

## 2022-05-22 DIAGNOSIS — R0683 Snoring: Secondary | ICD-10-CM

## 2022-05-22 DIAGNOSIS — E6609 Other obesity due to excess calories: Secondary | ICD-10-CM

## 2022-05-31 ENCOUNTER — Encounter: Payer: Self-pay | Admitting: Neurology

## 2022-06-05 NOTE — Progress Notes (Signed)
[facility] TEST REPORT ( by Watch PAT)   STUDY DATE:  10-04-202 DOB:  Jan 04, 1971     ORDERING CLINICIAN: Larey Seat, MD  REFERRING CLINICIAN: Crist Infante, MD   CLINICAL INFORMATION/HISTORY: 05-07-22 : 51 -year -old female with postnasal drip, sinus congestion, whose mother has witnessed snoring and apnea.    Epworth sleepiness score: 4/24. FSS at 12/ 63 points   BMI: 38 kg/m   Neck Circumference:    FINDINGS:   Sleep Summary:   Total Recording Time (hours, min):     9 hours and 59 minutes   Total Sleep Time (hours, min): 8 hours and 8 minutes               Percent REM (%):     22%                                   Respiratory Indices:   Calculated pAHI (per hour):    12/h                         REM pAHI:   19/h                                              NREM pAHI:     10/h                         Positional AHI:    The patient slept for the majority of the night on her left side followed by the right side and prone.  Interestingly the AHI was higher on the left with an AHI of 18/h been on the right or prone with AHI is between 7.4 and 7.6/h supine sleep was associated with an AHI of only 3.5/h.   The high left lateral positional AHI is related to all dream sleep, or REM sleep, taking place during left-sided sleep.      Mean volume of snoring was 42 dB                                          Oxygen Saturation Statistics:   Oxygen Saturation (%) Mean: 93%              Minimum oxygen saturation (%):    86%        O2 Saturation Range (%):    Between a nadir at 86% and a maximum saturation at 98%                                   O2 Saturation (minutes) <89%:   1 minute        Pulse Rate Statistics:   Pulse Mean (bpm):     64 bpm            Pulse Range:     Between 44 and 99 bpm            IMPRESSION:  This HST confirms the presence of mild sleep apnea  clearly more dependent on REM sleep which also  affected the AHI in left position.    RECOMMENDATION:  #1 I would like for the patient to start on an auto- titration CPAP device between 5 and 15 cm water pressure with 3 cm EPR, heated humidity and a interface of the patient's comfort.   If her sinus congestion is successfully treated she may be benefiting from a nasal CPAP interface. #2 a dental device would only reduce the normal REM sleep apneas and would still leave this patient with an overall AHI above 5/h.  The patient does not qualify for a inspire device by mildness of the AHI and by BMI. #3 weight loss will help the intensity of snoring and will also help to reduce the REM sleep dependent apnea.    INTERPRETING PHYSICIAN:   Larey Seat, MD   Medical Director of [facility].

## 2022-06-10 DIAGNOSIS — G4733 Obstructive sleep apnea (adult) (pediatric): Secondary | ICD-10-CM | POA: Insufficient documentation

## 2022-06-10 NOTE — Procedures (Signed)
[facility] TEST REPORT ( by Watch PAT)   STUDY DATE:  10-04-202 DOB:  [birth year]     ORDERING CLINICIAN: Larey Seat, MD  REFERRING CLINICIAN: Crist Infante, MD   CLINICAL INFORMATION/HISTORY: 05-07-22 : 51 -year -old female with postnasal drip, sinus congestion, whose mother has witnessed snoring and apnea.    Epworth sleepiness score: 4/24. FSS at 12/ 63 points   BMI: 38 kg/m   Neck Circumference:    FINDINGS:   Sleep Summary:   Total Recording Time (hours, min):     9 hours and 59 minutes   Total Sleep Time (hours, min): 8 hours and 8 minutes               Percent REM (%):     22%                                   Respiratory Indices:   Calculated pAHI (per hour):    12/h                         REM pAHI:   19/h                                              NREM pAHI:     10/h                         Positional AHI:    The patient slept for the majority of the night on her left side followed by the right side and prone.  Interestingly the AHI was higher on the left with an AHI of 18/h been on the right or prone with AHI is between 7.4 and 7.6/h supine sleep was associated with an AHI of only 3.5/h.   The high left lateral positional AHI is related to all dream sleep, or REM sleep, taking place during left-sided sleep.      Mean volume of snoring was 42 dB                                          Oxygen Saturation Statistics:   Oxygen Saturation (%) Mean: 93%              Minimum oxygen saturation (%):    86%        O2 Saturation Range (%):    Between a nadir at 86% and a maximum saturation at 98%                                   O2 Saturation (minutes) <89%:   1 minute        Pulse Rate Statistics:   Pulse Mean (bpm):     64 bpm            Pulse Range:     Between 44 and 99 bpm            IMPRESSION:  This HST confirms the presence of mild sleep apnea clearly  more dependent on REM sleep which also  affected the AHI in left position.    RECOMMENDATION:  #1 I would like for the patient to start on an auto- titration CPAP device between 5 and 15 cm water pressure with 3 cm EPR, heated humidity and a interface of the patient's comfort.   If her sinus congestion is successfully treated she may be benefiting from a nasal CPAP interface. #2 a dental device would only reduce the normal REM sleep apneas and would still leave this patient with an overall AHI above 5/h.  The patient does not qualify for a inspire device by mildness of the AHI and by BMI. #3 weight loss will help the intensity of snoring and will also help to reduce the REM sleep dependent apnea.    INTERPRETING PHYSICIAN:   Larey Seat, MD   Medical Director of [facility].

## 2022-06-10 NOTE — Addendum Note (Signed)
Addended by: Larey Seat on: 06/10/2022 10:24 AM   Modules accepted: Orders

## 2022-06-11 ENCOUNTER — Ambulatory Visit: Payer: BC Managed Care – PPO | Admitting: Cardiology

## 2022-06-11 ENCOUNTER — Telehealth: Payer: Self-pay | Admitting: Neurology

## 2022-06-11 NOTE — Telephone Encounter (Signed)
-----   Message from Larey Seat, MD sent at 06/10/2022 10:24 AM EDT ----- IMPRESSION:  This HST confirms the presence of mild sleep apnea ( AHI 12/h) which is clearly more dependent on REM sleep ( and which also affected the AHI in left position).   RECOMMENDATION:  #1 I would like for the patient to start on an auto- titration CPAP device between 5 and 15 cm water pressure with 3 cm EPR, heated humidity and a interface of the patient's comfort.   If her sinus congestion is successfully treated she may be benefiting from a nasal CPAP interface. #2 a dental device would only reduce the normal REM sleep apneas and would still leave this patient with an overall AHI above 5/h.  The patient does not qualify for a inspire device by mildness of the AHI and by BMI. #3 weight loss will help the intensity of snoring and will also help to reduce the REM sleep dependent apnea.

## 2022-06-11 NOTE — Telephone Encounter (Signed)
Called patient to discuss sleep study results. No answer at this time. LVM for the patient to call back.   

## 2022-06-12 ENCOUNTER — Encounter: Payer: Self-pay | Admitting: Neurology

## 2022-06-17 ENCOUNTER — Telehealth: Payer: Self-pay

## 2022-06-30 NOTE — Progress Notes (Signed)
Cardiology Office Note:    Date:  07/02/2022   ID:  [name], DOB 27-Aug-1970, MRN [id number]  PCP:  Rodrigo Ran, MD   [facility] Providers Cardiologist:  None {  Referring MD: Rodrigo Ran, MD    History of Present Illness:    [name] is a 51 y.o. female with a hx of anxiety, depression, OSA and HLD who was referred by Dr. Waynard Lembcke for further evaluation of HLD.  Patient states she overall feels well today. She is very active without anginal symptoms. Exercises regularly without issues. She states she recently had Ca score which was 0, but LDL was elevated at 161. She is wondering if she needs statin therapy.  Otherwise, she is doing very well. Has elevated BP in MD office but running 120s at home. Plans for 24h ambulatory monitoring.  Had sleep study with mild OSA. Plans to start CPAP.  Had hysterectomy and oophorectomy in her 30s.   Family history: Father with CAD with no history of MI or stents, had AAA. Mother with HTN, HLD. Maternal GF: MI, Paternal GF: MI but heavy smoker  Past Medical History:  Diagnosis Date   Anemia    after hip surgery   Anxiety    Degenerated intervertebral disc    Depression    GERD (gastroesophageal reflux disease)    Hyperlipidemia    PONV (postoperative nausea and vomiting)    Seasonal allergies     Past Surgical History:  Procedure Laterality Date   ABDOMINAL HYSTERECTOMY     INCISION AND DRAINAGE Right 05/31/2020   Procedure: INCISION AND DRAINAGE RIGHT HIP HEMATOMA;  Surgeon: Ollen Gross, MD;  Location: [facility];  Service: Orthopedics;  Laterality: Right;    LAPAROSCOPIC APPENDECTOMY N/A 01/04/2013   Procedure: APPENDECTOMY LAPAROSCOPIC;  Surgeon: Kandis Cocking, MD;  Location: [facility];  Service: General;  Laterality: N/A;   ROTATOR CUFF REPAIR Right    SHOULDER SURGERY Right 1990   TOTAL HIP ARTHROPLASTY Right     Current Medications: Current Meds  Medication Sig   Cholecalciferol  (VITAMIN D3) 50 MCG (2000 UT) capsule Take 2,000 Units by mouth daily.   escitalopram (LEXAPRO) 10 MG tablet Take 10 mg by mouth daily.   estradiol (CLIMARA - DOSED IN MG/24 HR) 0.0375 mg/24hr patch Place 1 patch onto the skin 2 (two) times a week. On mondays and thursdays    pantoprazole (PROTONIX) 40 MG tablet Take 40 mg by mouth as needed (As needed).     Allergies:   Sulfa antibiotics   Social History   Socioeconomic History   Marital status: Married    Spouse name: Not on file   Number of children: Not on file   Years of education: Not on file   Highest education level: Not on file  Occupational History   Not on file  Tobacco Use   Smoking status: Never   Smokeless tobacco: Never  Vaping Use   Vaping Use: Never used  Substance and Sexual Activity   Alcohol use: Yes    Alcohol/week: 3.0 standard drinks of alcohol    Types: 3 Glasses of wine per week   Drug use: No   Sexual activity: Not on file  Other Topics Concern   Not on file  Social History Narrative   Not on file   Social Determinants of Health   Financial Resource Strain: Not on file  Food Insecurity: Not on file  Transportation Needs: Not on file  Physical Activity:  Not on file  Stress: Not on file  Social Connections: Not on file     Family History: The patient's family history is not on file.  ROS:   Please see the history of present illness.     All other systems reviewed and are negative.  EKGs/Labs/Other Studies Reviewed:    The following studies were reviewed today: Ca Score 12/18/21: FINDINGS: CORONARY CALCIUM SCORES:   Left Main: 0   LAD: 0   LCx: 0   RCA: 0   Total Agatston Score: 0   MESA database percentile: 0   AORTA MEASUREMENTS:   Ascending Aorta: 0 35 mm   Descending Aorta: 20 mm   OTHER FINDINGS:   Heart is normal size. Aorta normal caliber. No adenopathy. No confluent opacities or effusions. No acute findings in the upper abdomen. Chest wall soft tissues are  unremarkable. No acute bony abnormality.   IMPRESSION: No visible coronary artery calcifications. Total coronary calcium score of 0.   No acute or significant extracardiac abnormality.  EKG:  EKG is  ordered today.  The ekg ordered today demonstrates sinus bradycardia with HR 57bpm  Recent Labs: No results found for requested labs within last 365 days.  Recent Lipid Panel No results found for: "CHOL", "TRIG", "HDL", "CHOLHDL", "VLDL", "LDLCALC", "LDLDIRECT"   Risk Assessment/Calculations:      HYPERTENSION CONTROL Vitals:   07/02/22 1337 07/02/22 1415  BP: (!) 148/92 (!) 131/90    The patient's blood pressure is elevated above target today.  In order to address the patient's elevated BP: The blood pressure is usually elevated in clinic.  Blood pressures monitored at home have been optimal.            Physical Exam:    VS:  BP (!) 131/90   Pulse (!) 57   Ht 5' (1.524 m)   Wt 196 lb (88.9 kg)   SpO2 98%   BMI 38.28 kg/m     Wt Readings from Last 3 Encounters:  07/02/22 196 lb (88.9 kg)  05/07/22 194 lb 9.6 oz (88.3 kg)  06/08/20 191 lb (86.6 kg)     GEN:  Well nourished, well developed in no acute distress HEENT: Normal NECK: No JVD; No carotid bruits CARDIAC: RRR, 1/6 systolic murmur. No rubs, gallops RESPIRATORY:  Clear to auscultation without rales, wheezing or rhonchi  ABDOMEN: Soft, non-tender, non-distended MUSCULOSKELETAL:  No edema; No deformity  SKIN: Warm and dry NEUROLOGIC:  Alert and oriented x 3 PSYCHIATRIC:  Normal affect   ASSESSMENT:    1. Hyperlipidemia, unspecified hyperlipidemia type   2. Medication management   3. Encounter for laboratory examination   4. Obesity (BMI 30-39.9)    PLAN:    In order of problems listed above:  #HLD: LDL 161. ASCVD risk 1.5%. Ca score 0. Will check Lp(a) and apolipoprotein B for further risk stratification but can defer statin therapy for now. Patient is very active and adheres to healthy diet.   -Check Lp(a), apoliprotein B for further risk stratification -Pending above findings, can consider statin therapy  #Obesity: BMI 38. Working very hard on lifestyle modifications. Will check into coverage for mounjaro.  Exercise recommendations: Goal of exercising for at least 30 minutes a day, at least 5 times per week.  Please exercise to a moderate exertion.  This means that while exercising it is difficult to speak in full sentences, however you are not so short of breath that you feel you must stop, and not so comfortable that  you can carry on a full conversation.  Exertion level should be approximately a 5/10, if 10 is the most exertion you can perform.  Diet recommendations: Recommend a heart healthy diet such as the Mediterranean diet.  This diet consists of plant based foods, healthy fats, lean meats, olive oil.  It suggests limiting the intake of simple carbohydrates such as white breads, pastries, and pastas.  It also limits the amount of red meat, wine, and dairy products such as cheese that one should consume on a daily basis.            Medication Adjustments/Labs and Tests Ordered: Current medicines are reviewed at length with the patient today.  Concerns regarding medicines are outlined above.  Orders Placed This Encounter  Procedures   Apolipoprotein B   Lipoprotein A (LPA)   EKG 12-Lead   No orders of the defined types were placed in this encounter.   Patient Instructions  Medication Instructions:   Your physician recommends that you continue on your current medications as directed. Please refer to the Current Medication list given to you today.  *If you need a refill on your cardiac medications before your next appointment, please call your pharmacy*   Lab Work:  TODAY--LIPOPROTEIN A AND APOLIPOPROTEIN B  If you have labs (blood work) drawn today and your tests are completely normal, you will receive your results only by: MyChart Message (if you have  MyChart) OR A paper copy in the mail If you have any lab test that is abnormal or we need to change your treatment, we will call you to review the results.    Follow-Up: At [facility], you and your health needs are our priority.  As part of our continuing mission to provide you with exceptional heart care, we have created designated Provider Care Teams.  These Care Teams include your primary Cardiologist (physician) and Advanced Practice Providers (APPs -  Physician Assistants and Nurse Practitioners) who all work together to provide you with the care you need, when you need it.  We recommend signing up for the patient portal called "MyChart".  Sign up information is provided on this After Visit Summary.  MyChart is used to connect with patients for Virtual Visits (Telemedicine).  Patients are able to view lab/test results, encounter notes, upcoming appointments, etc.  Non-urgent messages can be sent to your provider as well.   To learn more about what you can do with MyChart, go to [email or web address].    Your next appointment:   1 year(s)  The format for your next appointment:   In Person  Provider:   DR. Shari Prows   Other Instructions   Important Information About Sugar         Signed, Meriam Sprague, MD  07/02/2022 2:31 PM    [facility]

## 2022-07-02 ENCOUNTER — Ambulatory Visit: Payer: BC Managed Care – PPO | Attending: Cardiology | Admitting: Cardiology

## 2022-07-02 ENCOUNTER — Encounter: Payer: Self-pay | Admitting: Cardiology

## 2022-07-02 VITALS — BP 131/90 | HR 57 | Ht 60.0 in | Wt 196.0 lb

## 2022-07-02 DIAGNOSIS — E669 Obesity, unspecified: Secondary | ICD-10-CM

## 2022-07-02 DIAGNOSIS — Z79899 Other long term (current) drug therapy: Secondary | ICD-10-CM | POA: Diagnosis not present

## 2022-07-02 DIAGNOSIS — Z0189 Encounter for other specified special examinations: Secondary | ICD-10-CM

## 2022-07-02 DIAGNOSIS — E785 Hyperlipidemia, unspecified: Secondary | ICD-10-CM

## 2022-07-02 NOTE — Patient Instructions (Signed)
Medication Instructions:   Your physician recommends that you continue on your current medications as directed. Please refer to the Current Medication list given to you today.  *If you need a refill on your cardiac medications before your next appointment, please call your pharmacy*   Lab Work:  TODAY--LIPOPROTEIN A AND APOLIPOPROTEIN B  If you have labs (blood work) drawn today and your tests are completely normal, you will receive your results only by: [place] (if you have MyChart) OR A paper copy in the mail If you have any lab test that is abnormal or we need to change your treatment, we will call you to review the results.    Follow-Up: At [facility], you and your health needs are our priority.  As part of our continuing mission to provide you with exceptional heart care, we have created designated Provider Care Teams.  These Care Teams include your primary Cardiologist (physician) and Advanced Practice Providers (APPs -  Physician Assistants and Nurse Practitioners) who all work together to provide you with the care you need, when you need it.  We recommend signing up for the patient portal called "MyChart".  Sign up information is provided on this After Visit Summary.  MyChart is used to connect with patients for Virtual Visits (Telemedicine).  Patients are able to view lab/test results, encounter notes, upcoming appointments, etc.  Non-urgent messages can be sent to your provider as well.   To learn more about what you can do with MyChart, go to [email or web address].    Your next appointment:   1 year(s)  The format for your next appointment:   In Person  Provider:   DR. Johney Frame   Other Instructions   Important Information About Sugar

## 2022-07-03 ENCOUNTER — Other Ambulatory Visit: Payer: Self-pay | Admitting: Cardiology

## 2022-07-03 ENCOUNTER — Encounter: Payer: Self-pay | Admitting: Cardiology

## 2022-07-03 DIAGNOSIS — Z79899 Other long term (current) drug therapy: Secondary | ICD-10-CM

## 2022-07-03 DIAGNOSIS — E785 Hyperlipidemia, unspecified: Secondary | ICD-10-CM

## 2022-07-03 DIAGNOSIS — E6609 Other obesity due to excess calories: Secondary | ICD-10-CM

## 2022-07-03 LAB — LIPOPROTEIN A (LPA): Lipoprotein (a): 85.6 nmol/L — ABNORMAL HIGH (ref <75.0)

## 2022-07-03 LAB — APOLIPOPROTEIN B: Apolipoprotein B: 125 mg/dL — ABNORMAL HIGH (ref <90)

## 2022-07-03 MED ORDER — ATORVASTATIN CALCIUM 20 MG PO TABS: 20.0000 mg | ORAL_TABLET | Freq: Every day | ORAL | 9 refills | Status: DC

## 2022-07-18 NOTE — Telephone Encounter (Signed)
Noted  

## 2022-07-18 NOTE — Telephone Encounter (Signed)
Called pt. Scheduled initial CPAP appointment on 2/13 with Dr. Brett Fairy.

## 2022-07-31 NOTE — Telephone Encounter (Signed)
Alexandra Bergeron, MD  Alexandra Alpha, LPN Can we refer her to lipid clinic for consideration of PCSK9i. She is statin intolerant.  Referral to lipid clinic placed in the system.   Will send a message to our [facility] Scheduling team to reach out to the pt and arrange this appt.  Pt made aware of referral placed by Dr. Johney Frame, via this mychart message.

## 2022-08-01 NOTE — Telephone Encounter (Signed)
Pt is scheduled to see PharmD at [place] office on 09/03/22 at 1:30 pm.  Pt made aware of appt date and time by [facility] Scheduling team.

## 2022-09-03 ENCOUNTER — Ambulatory Visit: Payer: BC Managed Care – PPO

## 2022-10-01 ENCOUNTER — Encounter: Payer: Self-pay | Admitting: Neurology

## 2022-10-01 ENCOUNTER — Ambulatory Visit (INDEPENDENT_AMBULATORY_CARE_PROVIDER_SITE_OTHER): Payer: BC Managed Care – PPO | Admitting: Neurology

## 2022-10-01 VITALS — BP 135/89 | HR 56 | Ht 60.0 in | Wt 192.0 lb

## 2022-10-01 DIAGNOSIS — G4733 Obstructive sleep apnea (adult) (pediatric): Secondary | ICD-10-CM

## 2022-10-01 NOTE — Progress Notes (Signed)
Provider:  Larey Seat, MD  Primary Care Physician:  Crist Infante, MD [address] [place] [address]     Referring Provider: Crist Infante, [place],  [place] [address]          Chief Complaint according to patient   Patient presents with:     New Patient (Initial Visit)           HISTORY OF PRESENT ILLNESS:  [name] is a 52 y.o. female patient who is here for revisit 10/01/2022 for  CPAP complaince.  Chief concern according to patient :  I have got used to CPAP, but wanted to ask some questions about the HST results."I do not snore anymore, my sinus disease is much improved and I dont go to the bathroom at night anymore. I sleep deeper. "  The home sleep test that the patient underwent in October last year showed clearly mild overall obstructive sleep apnea with an AHI of 12 and during REM sleep the AHI was close to 19/h so there was REM sleep dependence -there was also a positional difference between sleep and the lateral right lateral left-sided position, and reviewing the hypnogram, its suggesting hat the REM sleep drove this AHI .     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  So overall  actual usage average 4 hours and 6 minutes of CPAP time , days of use average is 5 hours and 36 minutes.  She forgot to take the machine with her on the trip to [place] which may have reduced her compliance significantly.  However the AHI is 2.7/h that is a good resolution of apnea and her pressure at the 95th percentile is 9.1.  The residual apneas are obstructive and not central in nature which is also important to mention.  She has almost no air leakage.  There is no need for me to actually change any of the settings I just encouraged her to use it every day 4 hours minimum.  Review of Systems: Out of a complete 14 system review, the patient  complains of only the following symptoms, and all other reviewed systems are negative.:  ."I do not snore anymore, my sinus disease is much improved and I dont go to the bathroom at night anymore. I sleep deeper. "  no naps.  How likely are you to doze in the following situations: 0 = not likely, 1 = slight chance, 2 = moderate chance, 3 = high chance   Sitting and Reading? Watching Television? Sitting inactive in a public place (theater or meeting)? As a passenger in a car for an hour without a break? Lying down in the afternoon when circumstances permit? Sitting and talking to someone? Sitting quietly after lunch without alcohol? In a car, while stopped for a few minutes in traffic?   Total = 3/ 24 points , was 4/ 24 pre CPAP , but loud snoring,   FSS endorsed at 14/ 63 points. Was 12/ 63 pre CPAP.     Social History   Socioeconomic History   Marital status: Married    Spouse name: Not on file   Number of children: Not on file   Years of education: Not on file   Highest education level: Not on file  Occupational History   Not on file  Tobacco Use   Smoking status: Never   Smokeless tobacco: Never  Vaping Use   Vaping Use: Never used  Substance and Sexual Activity   Alcohol use: Yes    Alcohol/week: 3.0 standard drinks of alcohol    Types: 3 Glasses of wine per week   Drug use: No   Sexual activity: Not on file  Other Topics Concern   Not on file  Social History Narrative   Not on file   Social Determinants of Health   Financial Resource Strain: Not on file  Food Insecurity: Not on file  Transportation Needs: Not on file  Physical Activity: Not on file  Stress: Not on file  Social Connections: Not on file    History reviewed. No pertinent family history.  Past Medical History:  Diagnosis Date   Anemia    after hip surgery   Anxiety    Degenerated intervertebral disc    Depression    GERD (gastroesophageal reflux disease)    Hyperlipidemia    PONV  (postoperative nausea and vomiting)    Seasonal allergies     Past Surgical History:  Procedure Laterality Date   ABDOMINAL HYSTERECTOMY     INCISION AND DRAINAGE Right 05/31/2020   Procedure: INCISION AND DRAINAGE RIGHT HIP HEMATOMA;  Surgeon: Gaynelle Arabian, MD;  Location: [facility];  Service: Orthopedics;  Laterality: Right;  62mn   LAPAROSCOPIC APPENDECTOMY N/A 01/04/2013   Procedure: APPENDECTOMY LAPAROSCOPIC;  Surgeon: DShann Medal MD;  Location: [facility];  Service: General;  Laterality: N/A;   ROTATOR CUFF REPAIR Right    SHOULDER SURGERY Right 1990   TOTAL HIP ARTHROPLASTY Right      Current Outpatient Medications on File Prior to Visit  Medication Sig Dispense Refill   Cholecalciferol (VITAMIN D3) 50 MCG (2000 UT) capsule Take 2,000 Units by mouth daily.     escitalopram (LEXAPRO) 10 MG tablet Take 10 mg by mouth daily.     estradiol (CLIMARA - DOSED IN MG/24 HR) 0.0375 mg/24hr patch Place 1 patch onto the skin 2 (two) times a week. On mondays and thursdays      pantoprazole (PROTONIX) 40 MG tablet Take 40 mg by mouth as needed (As needed).     No current facility-administered medications on file prior to visit.    Allergies  Allergen Reactions   Sulfa Antibiotics Hives     DIAGNOSTIC DATA (LABS, IMAGING, TESTING) - I reviewed patient records, labs, notes, testing and imaging myself where available.  Lab Results  Component Value Date   WBC 6.3 05/31/2020   HGB 10.2 (L) 05/31/2020   HCT 31.9 (L) 05/31/2020   MCV 101.3 (H) 05/31/2020   PLT 534 (H) 05/31/2020      Component Value Date/Time   NA 137 01/04/2013 2212   K 4.6 01/04/2013 2212   CL 100 01/04/2013 2212   CO2 27 01/04/2013 2212   GLUCOSE 89 01/04/2013 2212   BUN 7 01/04/2013 2212   CREATININE 0.71 01/04/2013 2212   CALCIUM 9.1 01/04/2013 2212   PROT 7.3 03/21/2012 1400   ALBUMIN 4.4 03/21/2012 1400   AST 29 03/21/2012 1400   ALT 27 03/21/2012 1400   ALKPHOS 53 03/21/2012 1400   BILITOT 0.5  03/21/2012 1400   GFRNONAA >90 01/04/2013 2212   GFRAA >90 01/04/2013 2212   No results found for: "CHOL", "HDL", "LDLCALC", "LDLDIRECT", "TRIG", "CHOLHDL" No results found for: "HGBA1C" No results found for: "VITAMINB12" No results found for: "TSH"  PHYSICAL EXAM:  Today's Vitals  10/01/22 1017  BP: 135/89  Pulse: (!) 56  Weight: 192 lb (87.1 kg)  Height: 5' (1.524 m)   Body mass index is 37.5 kg/m.   Wt Readings from Last 3 Encounters:  10/01/22 192 lb (87.1 kg)  07/02/22 196 lb (88.9 kg)  05/07/22 194 lb 9.6 oz (88.3 kg)     Ht Readings from Last 3 Encounters:  10/01/22 5' (1.524 m)  07/02/22 5' (1.524 m)  05/07/22 5' (1.524 m)      General: The patient is awake, alert and appears not in acute distress. The patient is well groomed. Head: Normocephalic, atraumatic. Neck is supple.  Cardiovascular:  Regular rate and cardiac rhythm by pulse,  without distended neck veins. Respiratory: Lungs are clear to auscultation.  Skin:  Without evidence of ankle edema, or rash. Trunk: The patient's posture is erect.   NEUROLOGIC EXAM: The patient is awake and alert, oriented to place and time.   Memory subjective described as intact.  Attention span & concentration ability appears normal.  Speech is fluent,  without  dysarthria, dysphonia or aphasia.  Mood and affect are appropriate.   Cranial nerves: no loss of smell or taste reported  Pupils are equal and briskly reactive to light. Hearing was intact to soft voice and finger rubbing.    Facial sensation intact to fine touch.  Facial motor strength is symmetric and tongue and uvula move midline.  Neck ROM : rotation, tilt and flexion extension were normal for age and shoulder shrug was symmetrical.    Motor exam:  Symmetric bulk, tone and ROM.     Deep tendon reflexes: in the  upper and lower extremities are symmetric and intact.  Babinski response was deferred .    ASSESSMENT AND PLAN 52 y.o. 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   2) nocturia and snoring have resolved.   3) RV in 12 months.   I plan to follow up either personally or through our NP within 12 months.   I would like to thank Crist Infante, MD and Crist Infante, [place],  [place] [address] for allowing me to meet with and to take care of this pleasant patient.   CC: I will share my notes with PCP.  After spending a total time of  15  minutes face to face and additional time for physical and neurologic examination, review of laboratory studies,  personal review of imaging studies, reports and results of other testing and review of referral information / records as far as provided in visit,   Electronically signed by: Larey Seat, MD 10/01/2022 10:40 AM  [facility] [place] certified by The [employer] of Sleep Medicine and Diplomate of the [employer] of Sleep Medicine. Board certified In Neurology through the [place], Fellow of the [employer] of Neurology. Medical Director of [employer].

## 2022-10-08 ENCOUNTER — Ambulatory Visit: Payer: BC Managed Care – PPO | Attending: Cardiovascular Disease | Admitting: Pharmacist

## 2022-10-08 VITALS — Wt 197.0 lb

## 2022-10-08 DIAGNOSIS — E785 Hyperlipidemia, unspecified: Secondary | ICD-10-CM

## 2022-10-08 DIAGNOSIS — Z6838 Body mass index (BMI) 38.0-38.9, adult: Secondary | ICD-10-CM

## 2022-10-08 DIAGNOSIS — E6609 Other obesity due to excess calories: Secondary | ICD-10-CM

## 2022-10-08 NOTE — Assessment & Plan Note (Signed)
Assessment: Discussed the pros and con of GLP-1 therapy for weightloss Discussed what I feel is the most important to focus on- fiber and protein Discussed decreasing alcohol intake  Plan: Patient not interested in GLP-1 therpay which is think is very reasonable Will work on nutrition and continue exercise

## 2022-10-08 NOTE — Assessment & Plan Note (Signed)
Assessment: Elevated TG, ApoB and LDL-P are of concern Certainly reasonable to treat, but we talked about the risks and the benefits Patient could improve diet  Plan: Will give 6 months of dietary changes and recheck Depending on labs at that time, will determine if treatment with another statin is desired by patient Would like to see LDL-C at least <130, ApoB <90 and TG <150

## 2022-10-08 NOTE — Progress Notes (Signed)
Patient ID: [name]                 DOB: 10-Nov-1970                    MRN: [id number]      HPI: [name] is a 52 y.o. female patient referred to lipid clinic by Dr. Johney Frame. PMH is significant for anxiety, depression, OSA and HLD. Patient had a calcium score of 0, but LDL-C 161, LDL-P 1483, ApoB 125, TG 158.  She was started on atorvastatin 22m daily, however she developed muscle aches and it was stopped.   Patient presents today to clinic. She is a NP at [employer] She exercises 5 days a week. She does 2 days of body pump, 1 day of yoga and 2 days of the [employer]. She has done [employer] the past to lose weight. Was also on Ozempic for 1 year. Lost 12 lb. Didn't really like the way it make her feel. Made her reflux worse and her more moody. Would feel comfortable with her weight about 170-180lb. Not strongly compelled to start medication for her cholesterol. States she gets different opinions from everyone she talks to.   We discussed her NMR, ApoB and LPa. Luckily her LPa is not elevated enough to be of concern. ApoB is approaching [id number] I certainly feel it would be reasonable to treat. We discussed the importance of nutrition and exercise in combo.   Current Medications: none Intolerances: atorvastatin 249mRisk Factors: LDL-C >160 LDL-C goal: <100 ApoB goal: <90  Diet: very minimal red meat Weight watchers Water Snacks: late night snacker   Exercise: body pump x 2 days, pelaton and yoga (5 days a week)  Family History: Dad- mild CAD, sister, Niece high cholesterol  Social History: quit smoking 12 years ago, +2 drinks per day, 3 during the weekends  Labs: Lipid Panel  02/27/22 LDL-P 1483, LDL-C 161, HDL-C 77, TG 158, LP-IR 36 07/02/22 LP(a) 85.6, ApoB 125  No results found for: "CHOL", "TRIG", "HDL", "CHOLHDL", "VLDL", "LDLCALC", "LDLDIRECT", "LABVLDL"  Past Medical History:  Diagnosis Date   Anemia    after hip surgery   Anxiety     Degenerated intervertebral disc    Depression    GERD (gastroesophageal reflux disease)    Hyperlipidemia    PONV (postoperative nausea and vomiting)    Seasonal allergies     Current Outpatient Medications on File Prior to Visit  Medication Sig Dispense Refill   Cholecalciferol (VITAMIN D3) 50 MCG (2000 UT) capsule Take 2,000 Units by mouth daily.     escitalopram (LEXAPRO) 10 MG tablet Take 10 mg by mouth daily.     estradiol (CLIMARA - DOSED IN MG/24 HR) 0.0375 mg/24hr patch Place 1 patch onto the skin 2 (two) times a week. On mondays and thursdays      pantoprazole (PROTONIX) 40 MG tablet Take 40 mg by mouth as needed (As needed).     No current facility-administered medications on file prior to visit.    Allergies  Allergen Reactions   Sulfa Antibiotics Hives    Assessment/Plan:  1. Hyperlipidemia -  Class 2 obesity due to excess calories without serious comorbidity with body mass index (BMI) of 38.0 to 38.9 in adult Assessment: Discussed the pros and con of GLP-1 therapy for weightloss Discussed what I feel is the most important to focus on- fiber and protein Discussed decreasing alcohol intake  Plan: Patient not interested in  GLP-1 therpay which is think is very reasonable Will work on nutrition and continue exercise  Hyperlipidemia Assessment: Elevated TG, ApoB and LDL-P are of concern Certainly reasonable to treat, but we talked about the risks and the benefits Patient could improve diet  Plan: Will give 6 months of dietary changes and recheck Depending on labs at that time, will determine if treatment with another statin is desired by patient Would like to see LDL-C at least <130, ApoB <90 and TG <150    Thank you,  Ramond Dial, Pharm.D, BCPS, CPP [facility] [address] [place], [place] [address]  Phone: [phone]; Fax: [phone]

## 2022-12-04 ENCOUNTER — Emergency Department (HOSPITAL_COMMUNITY)
Admission: EM | Admit: 2022-12-04 | Discharge: 2022-12-04 | Disposition: A | Discharge status: Home / Self Care | Payer: BC Managed Care – PPO | Attending: Emergency Medicine | Admitting: Emergency Medicine

## 2022-12-04 ENCOUNTER — Emergency Department (HOSPITAL_COMMUNITY): Payer: BC Managed Care – PPO

## 2022-12-04 ENCOUNTER — Encounter (HOSPITAL_COMMUNITY): Payer: Self-pay

## 2022-12-04 DIAGNOSIS — X509XXA Other and unspecified overexertion or strenuous movements or postures, initial encounter: Secondary | ICD-10-CM | POA: Diagnosis not present

## 2022-12-04 DIAGNOSIS — Z96649 Presence of unspecified artificial hip joint: Secondary | ICD-10-CM

## 2022-12-04 DIAGNOSIS — Y9342 Activity, yoga: Secondary | ICD-10-CM | POA: Insufficient documentation

## 2022-12-04 DIAGNOSIS — S79911A Unspecified injury of right hip, initial encounter: Secondary | ICD-10-CM | POA: Diagnosis present

## 2022-12-04 DIAGNOSIS — Z96641 Presence of right artificial hip joint: Secondary | ICD-10-CM | POA: Diagnosis not present

## 2022-12-04 DIAGNOSIS — S73004A Unspecified dislocation of right hip, initial encounter: Secondary | ICD-10-CM | POA: Diagnosis not present

## 2022-12-04 DIAGNOSIS — Y9239 Other specified sports and athletic area as the place of occurrence of the external cause: Secondary | ICD-10-CM | POA: Insufficient documentation

## 2022-12-04 MED ORDER — PROPOFOL 10 MG/ML IV BOLUS
INTRAVENOUS | Status: AC | PRN
  Administered 2022-12-04: 89.4 mg via INTRAVENOUS
  Administered 2022-12-04: 40 mg via INTRAVENOUS
  Administered 2022-12-04: 80 mg via INTRAVENOUS

## 2022-12-04 MED ORDER — HYDROMORPHONE HCL 1 MG/ML IJ SOLN: 1.0000 mg | INTRAMUSCULAR | Status: DC | PRN

## 2022-12-04 MED ORDER — HYDROMORPHONE HCL 1 MG/ML IJ SOLN
1.0000 mg | Freq: Once | INTRAMUSCULAR | Status: AC
  Administered 2022-12-04: 1 mg via INTRAVENOUS
  Filled 2022-12-04: qty 1

## 2022-12-04 MED ORDER — PROPOFOL 10 MG/ML IV BOLUS
1.0000 mg/kg | Freq: Once | INTRAVENOUS | Status: AC
  Administered 2022-12-04: 89.4 mg via INTRAVENOUS
  Filled 2022-12-04: qty 20

## 2022-12-04 NOTE — Progress Notes (Signed)
Called to patient room for conscious sedation and hip reduction. Patient has history of OSA and states she does wear a CPAP. Some airway manipulation required throughout procedure with manual ventilation for approximately 90 seconds secondary to apnea and oxygen desaturation. Patient awake with patent airway and stable VS upon RT departure from room.

## 2022-12-04 NOTE — ED Triage Notes (Signed)
Pt arrived via EMS from yoga studio, pt was dong yoga and heard a pop in R hip, pt has had hip replacement 31yrs ago. 10/10 pain. 1.14mcg of fentanyl given.

## 2022-12-04 NOTE — Discharge Instructions (Signed)
Monitor your condition carefully and do not hesitate to return here for concerning changes in your condition. ?

## 2022-12-04 NOTE — ED Provider Notes (Signed)
[facility] AT [facility] Provider Note   CSN: [id number] Arrival date & time: 12/04/22  1901     History  Chief Complaint  Patient presents with   Hip Injury    [name] is a 52 y.o. female.  HPI Patient presents with severe right hip pain.  She has a history of prior replacement and revision, today was at yoga, she felt a pop, severe onset of pain. Since that time no relief with anything.  She had a minor fall afterwards, but does not have pain anywhere else. She takes Lexapro otherwise no medications, has no allergies.     Home Medications Prior to Admission medications   Medication Sig Start Date End Date Taking? Authorizing Provider  Cholecalciferol (VITAMIN D3) 50 MCG (2000 UT) capsule Take 2,000 Units by mouth daily.    [provider]  escitalopram (LEXAPRO) 10 MG tablet Take 10 mg by mouth daily.    [provider]  estradiol (CLIMARA - DOSED IN MG/24 HR) 0.0375 mg/24hr patch Place 1 patch onto the skin 2 (two) times a week. On mondays and thursdays     [provider]  pantoprazole (PROTONIX) 40 MG tablet Take 40 mg by mouth as needed (As needed). 04/26/20   [provider]      Allergies    Sulfa antibiotics    Review of Systems   Review of Systems  All other systems reviewed and are negative.   Physical Exam Updated Vital Signs BP (!) 109/49   Pulse 75   Temp 98.4 F (36.9 C) (Oral)   Resp 14   Wt 89.4 kg   SpO2 93%   BMI 38.47 kg/m  Physical Exam Vitals and nursing note reviewed.  Constitutional:      General: She is not in acute distress.    Appearance: She is well-developed.     Comments: Uncomfortable appearing adult female  HENT:     Head: Normocephalic and atraumatic.  Eyes:     Conjunctiva/sclera: Conjunctivae normal.  Cardiovascular:     Rate and Rhythm: Normal rate and regular rhythm.  Pulmonary:     Effort: Pulmonary effort is normal. No respiratory  distress.     Breath sounds: No stridor.  Abdominal:     General: There is no distension.  Musculoskeletal:     Comments: Patient holding her right hip in flexion, knee in flexion, pulses appropriate distally color appropriate distally.  Patient describes pain throughout the right hip with any attempted range of motion.  Skin:    General: Skin is warm and dry.  Neurological:     Mental Status: She is alert and oriented to person, place, and time.     Cranial Nerves: No cranial nerve deficit.  Psychiatric:        Mood and Affect: Mood normal.     ED Results / Procedures / Treatments   Labs (all labs ordered are listed, but only abnormal results are displayed) Labs Reviewed - No data to display  EKG None  Radiology DG Hip [place] or [place] Pelvis 1 View Right  Result Date: 12/04/2022 CLINICAL DATA:  Reduction of right hip. EXAM: DG HIP (WITH OR WITHOUT PELVIS) 1V PORT RIGHT COMPARISON:  Earlier today FINDINGS: Relocated right hip. Negative for pelvic ring fracture or diastasis. Intact left hip. 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 Electronically Signed   By: Tiburcio Pea M.D.   On: 12/04/2022 21:11   DG Hip  Unilat  With Pelvis 2-3 Views Right  Result Date: 12/04/2022 CLINICAL DATA:  Hip pain. Heard a pop doing yoga. Prior right hip replacement. EXAM: DG HIP (WITH OR WITHOUT PELVIS) 2-3V RIGHT COMPARISON:  None Available. FINDINGS: Right hip arthroplasty. There is posterosuperior dislocation of the femoral head component with respect to the acetabular component. No fracture is seen. The pubic rami and bony pelvis are intact. IMPRESSION: Right hip arthroplasty with posterosuperior dislocation of the femoral head component. Electronically Signed   By: Narda Rutherford M.D.   On: 12/04/2022 19:57    Procedures .Ortho Injury Treatment  Date/Time: 12/04/2022 9:32 PM  Performed by: Gerhard Munch, MD Authorized by: Gerhard Munch, MD    Consent:    Consent obtained:  Verbal   Consent given by:  Patient   Risks discussed:  Restricted joint movement, stiffness, recurrent dislocation, irreducible dislocation and fracture   Alternatives discussed:  No treatmentInjury location: hip Location details: right hip Injury type: dislocation Dislocation type: posterior Spontaneous dislocation: yes Prosthesis: yes Pre-procedure neurovascular assessment: neurovascularly intact Pre-procedure distal perfusion: normal Pre-procedure neurological function: normal Pre-procedure range of motion: reduced  Anesthesia: Local anesthesia used: no  Patient sedated: Yes. Refer to sedation procedure documentation for details of sedation. Manipulation performed: yes Reduction method: traction and counter traction and abduction Reduction successful: yes X-ray confirmed reduction: yes Immobilization: crutches and brace Splint type: long leg Splint Applied by: ED Provider and Ortho Tech Supplies used: aluminum splint Post-procedure neurovascular assessment: post-procedure neurovascularly intact Post-procedure distal perfusion: normal Post-procedure neurological function: normal Post-procedure range of motion: improved   .Sedation  Date/Time: 12/04/2022 9:34 PM  Performed by: Gerhard Munch, MD Authorized by: Gerhard Munch, MD   Consent:    Consent obtained:  Verbal   Consent given by:  Patient   Risks discussed:  Prolonged hypoxia resulting in organ damage and respiratory compromise necessitating ventilatory assistance and intubation   Alternatives discussed:  Analgesia without sedation Universal protocol:    Procedure explained and questions answered to patient or proxy's satisfaction: yes     Relevant documents present and verified: yes     Test results available: yes     Imaging studies available: yes     Required blood products, implants, devices, and special equipment available: yes     Site/side marked: yes      Immediately prior to procedure, a time out was called: yes     Patient identity confirmed:  Verbally with patient Indications:    Procedure performed:  Dislocation reduction   Procedure necessitating sedation performed by:  Physician performing sedation Pre-sedation assessment:    Time since last food or drink:  4   ASA classification: class 2 - patient with mild systemic disease     Mouth opening:  2 finger widths   Thyromental distance:  2 finger widths   Mallampati score:  Unable to assess   Neck mobility: reduced     Pre-sedation assessments completed and reviewed: airway patency, cardiovascular function, hydration status, mental status, nausea/vomiting, pain level, respiratory function and temperature     Pre-sedation assessment completed:  12/04/2022 8:00 PM Immediate pre-procedure details:    Reassessment: Patient reassessed immediately prior to procedure     Reviewed: vital signs     Verified: bag valve mask available, emergency equipment available, intubation equipment available, IV patency confirmed, oxygen available and suction available   Procedure details (see MAR for exact dosages):    Preoxygenation:  Nasal cannula   Sedation:  Propofol   Intended level  of sedation: deep   Analgesia:  Hydromorphone   Intra-procedure monitoring:  Blood pressure monitoring, cardiac monitor, continuous capnometry, continuous pulse oximetry, frequent LOC assessments and frequent vital sign checks   Intra-procedure events: hypoxia     Intra-procedure management:  Airway repositioning, supplemental oxygen and BVM ventilation   Total Provider sedation time (minutes):  25 Post-procedure details:    Post-sedation assessment completed:  12/04/2022 9:35 PM   Attendance: Constant attendance by certified staff until patient recovered     Recovery: Patient returned to pre-procedure baseline     Post-sedation assessments completed and reviewed: airway patency, cardiovascular function, hydration status,  mental status, nausea/vomiting, pain level, respiratory function and temperature     Patient is stable for discharge or admission: yes     Procedure completion:  Tolerated well, no immediate complications     Medications Ordered in ED Medications  HYDROmorphone (DILAUDID) injection 1 mg (has no administration in time range)  HYDROmorphone (DILAUDID) injection 1 mg (1 mg Intravenous Given 12/04/22 1933)  propofol (DIPRIVAN) 10 mg/mL bolus/IV push 89.4 mg (89.4 mg Intravenous Given 12/04/22 2044)  HYDROmorphone (DILAUDID) injection 1 mg (1 mg Intravenous Given 12/04/22 2009)  propofol (DIPRIVAN) 10 mg/mL bolus/IV push (80 mg Intravenous Given 12/04/22 2055)    ED Course/ Medical Decision Making/ A&P                             Medical Decision Making Patient with history of prior right hip replacement presents with acute onset pain.  Concern for dislocation versus soft tissue injury as she was doing yoga when it occurred. Patient received IV Dilaudid, x-ray, will be reassessed.  Amount and/or Complexity of Data Reviewed External Data Reviewed: notes.    Details: MRI from last year hip reviewed Radiology: ordered and independent interpretation performed. Decision-making details documented in ED Course.  Risk Prescription drug management.   9:36 PM I reviewed the patient's x-rays pre and post procedure with her and her wife at bedside.  Illustrating the abduction of the hip.  We discussed follow-up instructions, return precautions.  Now, without other complaints, with discussed reduction of dislocated hip prosthesis, patient discharged in stable condition.        Final Clinical Impression(s) / ED Diagnoses Final diagnoses:  Dislocation of hip joint prosthesis, initial encounter    Rx / DC Orders ED Discharge Orders          Ordered    For home use only DME [facility]        12/04/22 2132              Gerhard Munch, MD 12/04/22 2136

## 2022-12-07 IMAGING — CT CT CARDIAC CORONARY ARTERY CALCIUM SCORE
3 series · 14 of 20 positions shown, 16 images · non-contrast
Comparison: None Available.

CLINICAL DATA: Hyperlipidemia, family history

EXAM:
CT CARDIAC CORONARY ARTERY CALCIUM SCORE
TECHNIQUE: Non-contrast imaging through the heart was performed using
prospective ECG gating. Image post processing was performed on an
independent workstation, allowing for quantitative analysis of the
heart and coronary arteries. Note that this exam targets the heart
and the chest was not imaged in its entirety.

[Series 2: calcium scoring 2.00 qr36 bestdiast 61% hrt calciu · axial · 0.40mm/px · z∈[+1406,+1490]mm · 4 of 70 slices shown]
[im 14/70  vessel]
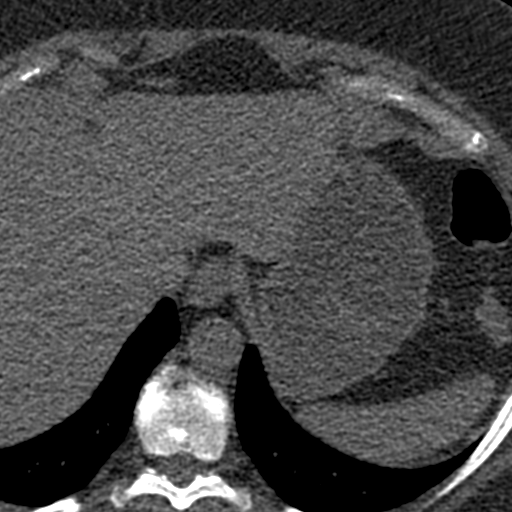
[im 28/70  vessel]
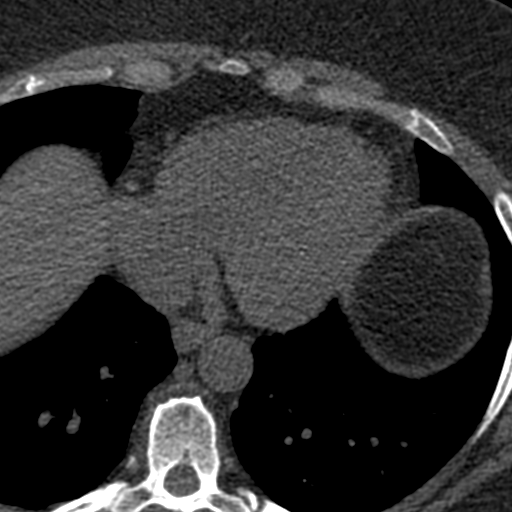
[im 42/70  vessel]
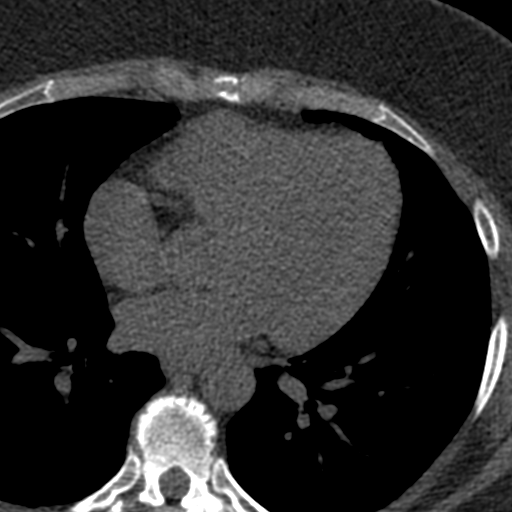
[im 56/70  vessel]
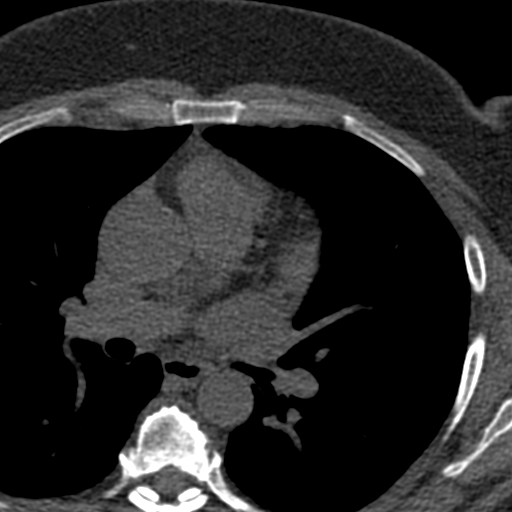

[Series 3: calcium scoring 2.00 br40 bestdiast 61% axial · axial · 0.61mm/px · z∈[+1402,+1494]mm · 5 of 70 slices shown, 7 images]
[im 12/70  vessel]
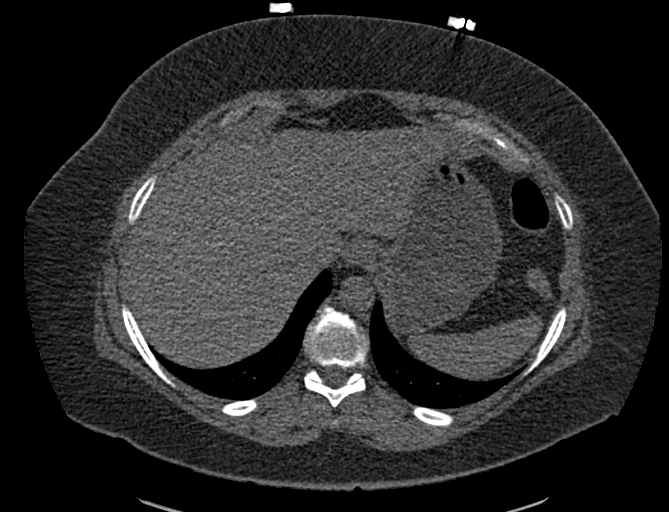
[im 12/70  lung]
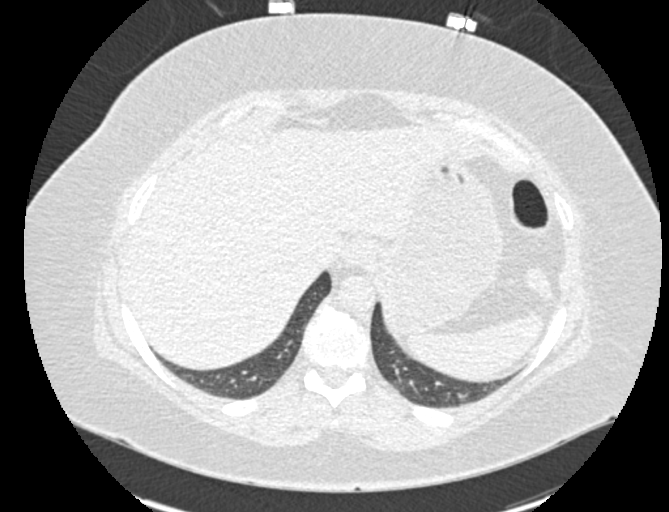
[im 24/70  vessel]
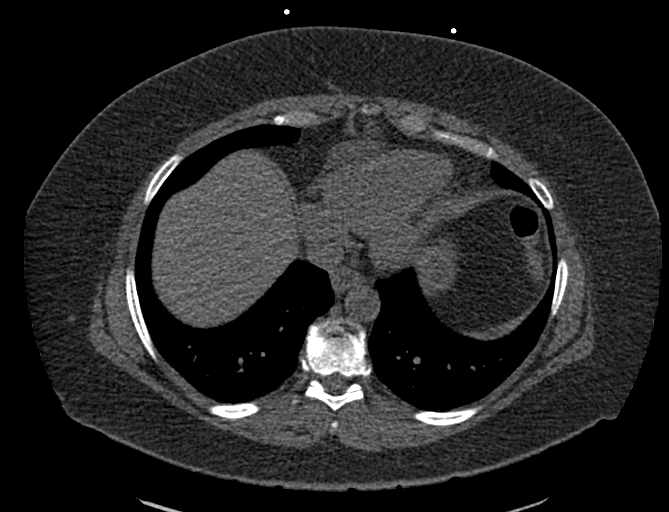
[im 35/70  vessel]
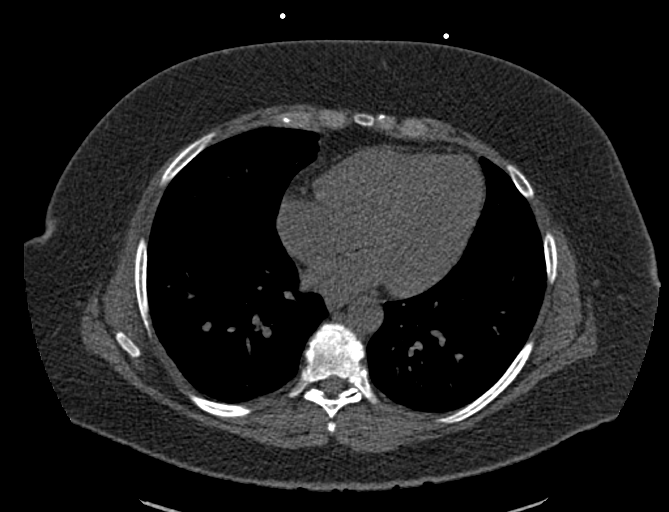
[im 47/70  vessel]
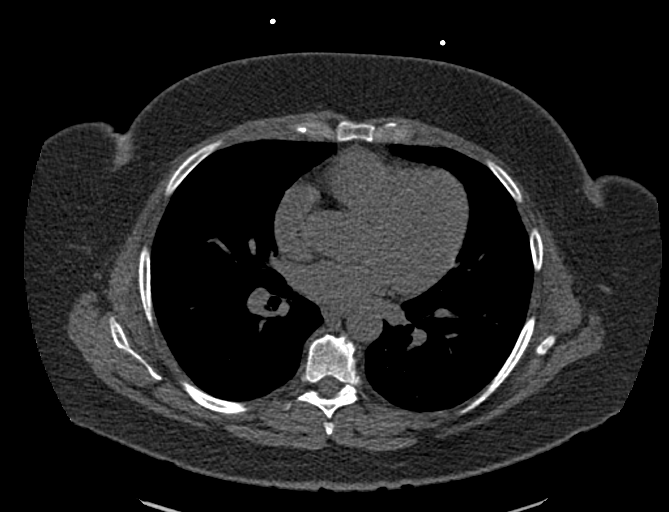
[im 58/70  vessel]
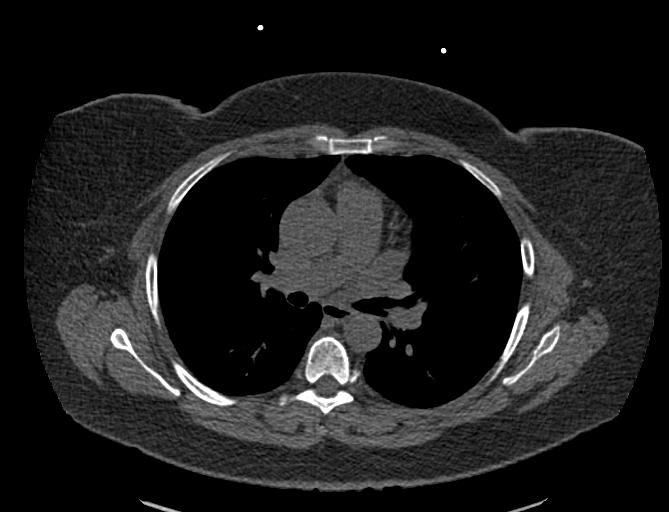
[im 58/70  lung]
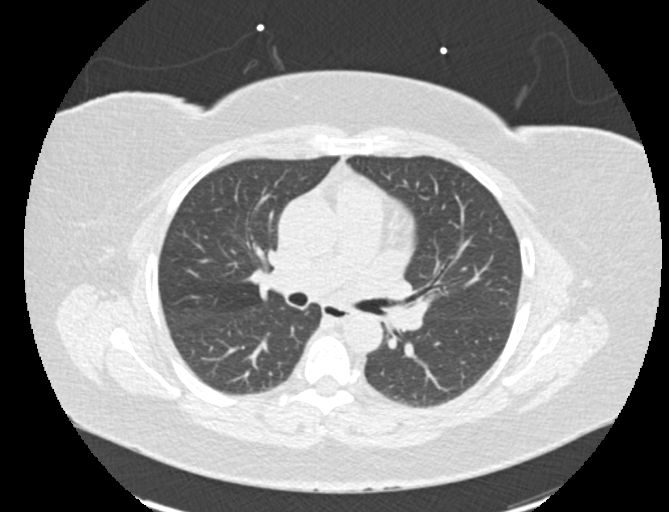

[Series 9: calcium scoring 2.00 br60 bestdiast 61% lungs · axial · 0.61mm/px · z∈[+1402,+1494]mm · 5 of 70 slices shown]
[im 12/70  vessel]
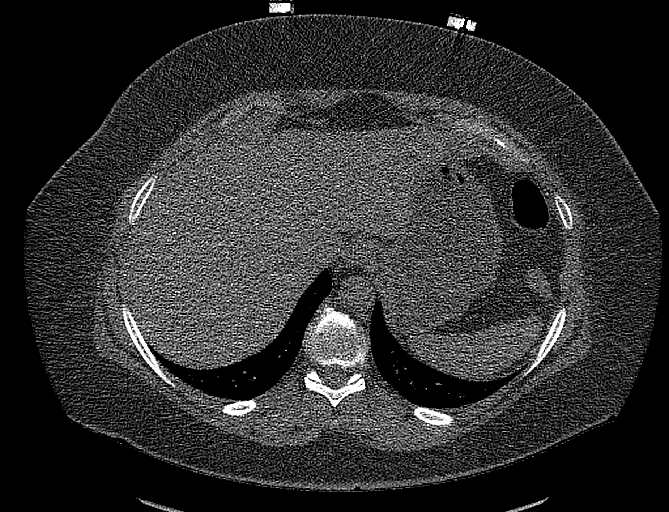
[im 24/70  vessel]
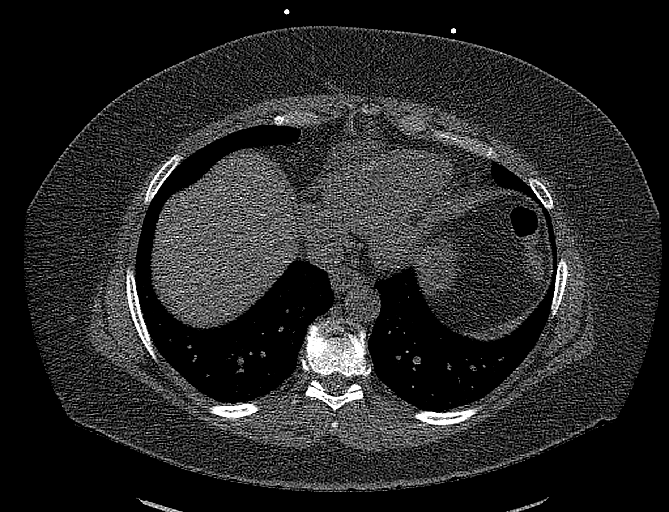
[im 35/70  vessel]
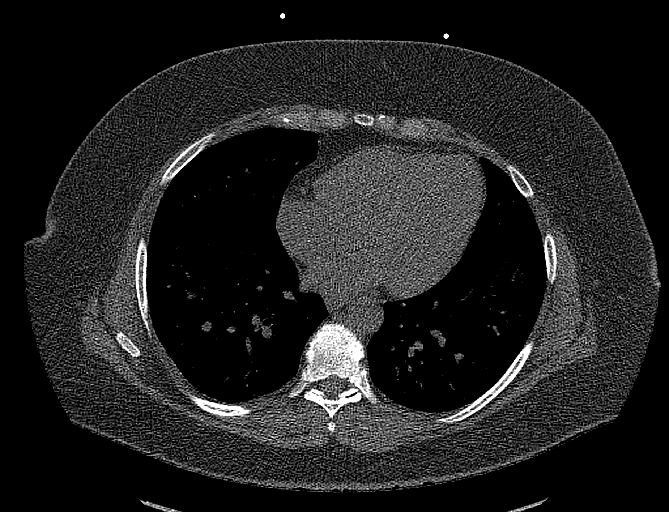
[im 47/70  vessel]
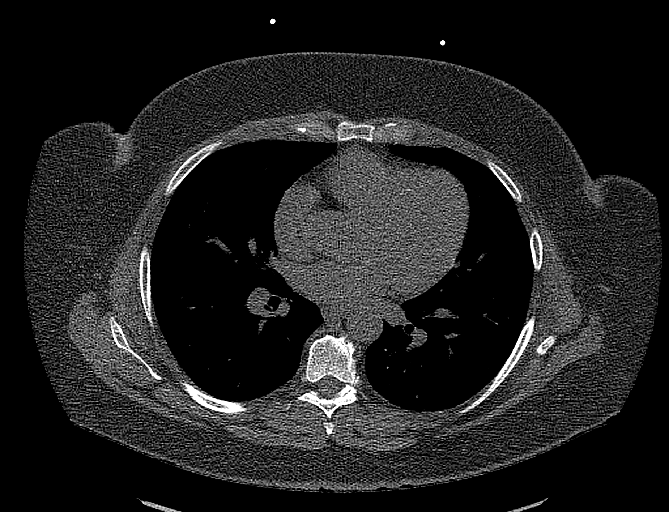
[im 58/70  vessel]
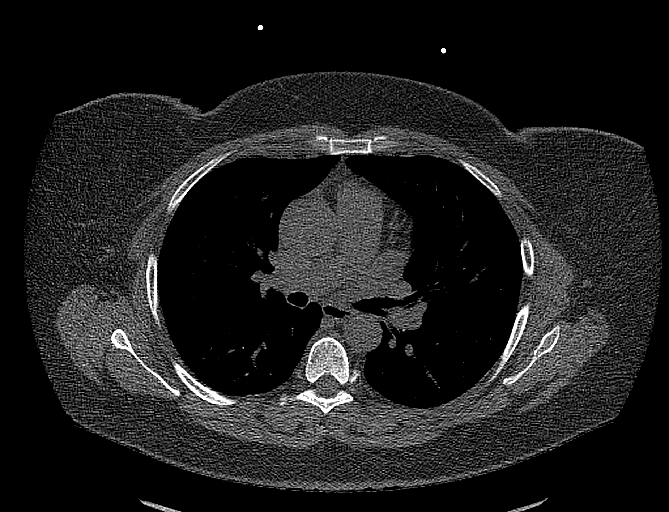

[14 of 20 positions shown; findings below may reference images not displayed]

FINDINGS: CORONARY CALCIUM SCORES:

Left Main: 0

LAD: 0

LCx: 0

RCA: 0

Total Agatston Score: 0

[HOSPITAL] percentile: 0

AORTA MEASUREMENTS:

Ascending Aorta: 0 35 mm

Descending Aorta: 20 mm

OTHER FINDINGS:

Heart is normal size. Aorta normal caliber. No adenopathy. No
confluent opacities or effusions. No acute findings in the upper
abdomen. Chest wall soft tissues are unremarkable. No acute bony
abnormality.
IMPRESSION: No visible coronary artery calcifications. Total coronary calcium
score of 0.

No acute or significant extracardiac abnormality.

## 2023-03-26 ENCOUNTER — Encounter: Payer: Self-pay | Admitting: Pharmacist

## 2023-03-28 ENCOUNTER — Other Ambulatory Visit: Payer: Self-pay | Admitting: Orthopedic Surgery

## 2023-03-28 DIAGNOSIS — M25561 Pain in right knee: Secondary | ICD-10-CM

## 2023-04-18 ENCOUNTER — Ambulatory Visit
Admission: RE | Admit: 2023-04-18 | Discharge: 2023-04-18 | Disposition: A | Discharge status: Home / Self Care | Payer: BC Managed Care – PPO | Source: Ambulatory Visit | Attending: Orthopedic Surgery | Admitting: Orthopedic Surgery

## 2023-04-18 DIAGNOSIS — M25561 Pain in right knee: Secondary | ICD-10-CM

## 2023-05-23 ENCOUNTER — Other Ambulatory Visit: Payer: Self-pay | Admitting: Internal Medicine

## 2023-05-23 DIAGNOSIS — Z1231 Encounter for screening mammogram for malignant neoplasm of breast: Secondary | ICD-10-CM

## 2023-06-24 ENCOUNTER — Ambulatory Visit
Admission: RE | Admit: 2023-06-24 | Discharge: 2023-06-24 | Disposition: A | Discharge status: Home / Self Care | Payer: BC Managed Care – PPO | Source: Ambulatory Visit | Attending: Internal Medicine | Admitting: Internal Medicine

## 2023-06-24 DIAGNOSIS — Z1231 Encounter for screening mammogram for malignant neoplasm of breast: Secondary | ICD-10-CM

## 2024-04-07 ENCOUNTER — Encounter: Payer: Self-pay | Admitting: Neurology

## 2024-04-07 ENCOUNTER — Other Ambulatory Visit: Payer: Self-pay

## 2024-04-07 DIAGNOSIS — R202 Paresthesia of skin: Secondary | ICD-10-CM

## 2024-04-08 ENCOUNTER — Other Ambulatory Visit: Payer: Self-pay | Admitting: Internal Medicine

## 2024-04-08 DIAGNOSIS — M5412 Radiculopathy, cervical region: Secondary | ICD-10-CM

## 2024-04-13 ENCOUNTER — Ambulatory Visit
Admission: RE | Admit: 2024-04-13 | Discharge: 2024-04-13 | Disposition: A | Discharge status: Home / Self Care | Source: Ambulatory Visit | Attending: Internal Medicine | Admitting: Internal Medicine

## 2024-04-13 DIAGNOSIS — M5412 Radiculopathy, cervical region: Secondary | ICD-10-CM

## 2024-05-12 ENCOUNTER — Ambulatory Visit: Admitting: Sports Medicine

## 2024-05-12 ENCOUNTER — Encounter: Payer: Self-pay | Admitting: Sports Medicine

## 2024-05-12 ENCOUNTER — Other Ambulatory Visit: Payer: Self-pay

## 2024-05-12 DIAGNOSIS — G5602 Carpal tunnel syndrome, left upper limb: Secondary | ICD-10-CM | POA: Diagnosis not present

## 2024-05-12 DIAGNOSIS — M353 Polymyalgia rheumatica: Secondary | ICD-10-CM | POA: Diagnosis not present

## 2024-05-12 DIAGNOSIS — M25532 Pain in left wrist: Secondary | ICD-10-CM | POA: Diagnosis not present

## 2024-05-12 NOTE — Progress Notes (Addendum)
 [name] - 53 y.o. female MRN [id number]  Date of birth: [birth year]  Office Visit Note: Visit Date: 05/12/2024 PCP: Shayne Anes, MD Referred by: Shayne Anes, MD  Subjective: Chief Complaint  Patient presents with   Left Wrist - Pain   HPI: [name] is a pleasant 53 y.o. female who presents today for chronic left wrist pain.  She has had left hand and wrist pain for many months.  Has numbness tingling/burning as well as associated pain.  She does have stiffness in the wrist as well.  She has done formalized physical therapy for the last 3 months without much improvement.  She has been working with Norleen Grana, recommended further evaluation for carpal tunnel eval. [name] does note that she has weakness in grip strength, she has been dropping objects with the left side.  She also notices muscle atrophy in the palm and thumb of the left hand.   Hx of polymyalgia rheumatica - had significant neck, shoulder, knee, ankle pain and stiffness for months.  Began a chronic prednisone taper starting in April of this year, and is currently taking this on 9 mg daily.  She has trialed gabapentin and does take 300 mg as needed with only mild relief.  She does have a cock up wrist brace that she has been wearing as well.  Her symptoms are significantly interfering with sleep, ADLs.  Pertinent ROS were reviewed with the patient and found to be negative unless otherwise specified above in HPI.   Assessment & Plan: Visit Diagnoses:  1. Carpal tunnel syndrome, left upper limb   2. Pain in left wrist   3. Polymyalgia rheumatica    Plan: Impression is chronic left wrist pain which does have diagnostic evidence of carpal tunnel syndrome, although I think her pain is also multifactorial.  She has radiating pain and tingling in the median nerve distribution from digits 1-4.  The concerning finding is that she is actually having motor loss with diminished grip squeeze, dropping items  and there is evidence of thenar atrophy on exam today.  I do believe she would need surgical carpal tunnel release.  She does have an EMG/nerve conduction study upcoming October 20, it would be good to review these and see her degree of both motor and nerve dysfunction.  Given that I do believe this would require surgical intervention, I will have her follow-up with her hand and wrist surgeon, Dr. Erwin to discuss surgical options for her carpal tunnel.  I did discuss with [name] however, she does have pain and stiffness in the wrist joint which I believe is more so secondary to her polymyalgia rheumatica as this has affected multiple joints for her.  She has restriction with flexion, and although her x-rays do show mild arthritic change I believe it is secondary more so to PMR as carpal tunnel would not cause stiffness or range of motion restriction.  She is understanding of how these both are playing a role.  Given the severity of her discomfort/pain, she would like to move forward with carpal tunnel injection, we did perform this under ultrasound guidance today.  She will go into her cock up wrist brace for the next 48 hours both day and night and then transition to nighttime use afterward.  She will continue her long prednisone taper for her PMR diagnosis, but would recommend gabapentin 300 mg nightly for her carpal tunnel.  I am happy to see her back as needed, but recommend further surgical  evaluation by Dr. Erwin following her EMG/NCS.  Follow-up: Return in about 1 month (around 06/14/2024) for Schedule with Dr. Erwin for Left carpal tunnel .   Meds & Orders: No orders of the defined types were placed in this encounter.   Orders Placed This Encounter  Procedures   US  Guided Needle Placement - No Linked Charges   US  Extrem Up Left Ltd     Procedures: Procedure: US -guided Carpal Tunnel Injection, Left Wrist After informed verbal consent and discussion on R/B/I, a timeout was performed, patient was  seated on exam table with the affected arm placed in supine position on table. The area overlying the patient's carpal tunnel was prepped with Betadine  and alcohol swabs  then utilizing ultrasound guidance via an in-plane approach, the patient's carpal tunnel was injected with a mixture of 1:1:1:1 lidocaine :bupivicaine:D5W:betamethasone with hydrodissection of the median nerve from the flexor retinaculum in 360 degree fashion. Visualization of the needle was achieved with a transverse, in-plane approach. Patient tolerated procedure well without immediate complications.         US  Guided Needle Placement - No Linked Charges Procedurally successful ultrasound-guided carpal tunnel injection with associated median nerve Hydrodissection.  Clinical History: No specialty comments available.  She reports that she has never smoked. She has never used smokeless tobacco. No results for input(s): HGBA1C, LABURIC in the last 8760 hours.  Objective:    Physical Exam  Gen: Well-appearing, in no acute distress; non-toxic CV: Well-perfused. Warm.  Resp: Breathing unlabored on room air; no wheezing. Psych: Fluid speech in conversation; appropriate affect; normal thought process  Ortho Exam - Left wrist: No redness swelling or effusion.  There is restriction with flexion of the wrist which is about 50 degrees less than the contralateral wrist, very minimal extension restriction.  Positive Tinel's over the carpal tunnel, positive Durkan's and reverse Phalen's test.  There is a degree of thenar eminence atrophy on the left side with full muscle bulk on the contralateral right thumb/wrist.  There is diminished grip strength with close wrist squeeze on the left compared to 5/5 strength on the right.  Imaging:  *US  Extrem Up Left Ltd Narrative: Limited musculoskeletal ultrasound of the left volar wrist and carpal  tunnel was performed today. Images saved under 'needle guidance' but this  was a diagnostic  study.  Evaluation of the median nerve was visualized  with enlarged and swollen fascicles.  There is enlargement of the median  nerve within the carpal tunnel which is approximately 0.112 cm in nature  at the inlet and up to 0.120 cm scanning distally just proximal to the  carpal tunnel outlet.  There is thickening of the septum which likely is  from compression versus incomplete bifid median nerve.  There is no soft  tissue swelling, no gross joint effusion. Impression: Evidence of carpal tunnel syndrome with enlarged median nerve  with swollen fascicles.    Narrative & Impression  CLINICAL DATA:  Cervical radiculopathy   EXAM: MRI CERVICAL SPINE WITHOUT CONTRAST   TECHNIQUE: Multiplanar, multisequence MR imaging of the cervical spine was performed. No intravenous contrast was administered.   COMPARISON:  None Available.   FINDINGS: Alignment: Straightening of the cervical lordosis.  No listhesis.   Vertebrae: No fracture, evidence of discitis, or bone lesion. Mild reactive subchondral marrow edema associated with the left C3-4 facet joint.   Cord: Normal signal and morphology.   Posterior Fossa, vertebral arteries, paraspinal tissues: No acute abnormality.   Disc levels:   C2-C3: No significant disc  protrusion, foraminal stenosis, or canal stenosis.   C3-C4: Left greater than right facet hypertrophy. No disc protrusion. No foraminal or canal stenosis.   C4-C5: Mild left greater than right facet hypertrophy without foraminal or canal stenosis.   C5-C6: Disc osteophyte complex with right greater than left uncovertebral spurring and mild facet hypertrophy. No canal stenosis. Moderate right foraminal stenosis.   C6-C7: No significant disc protrusion, foraminal stenosis, or canal stenosis.   C7-T1: No significant disc protrusion, foraminal stenosis, or canal stenosis.   IMPRESSION: 1. Mild cervical spondylosis most pronounced at C5-6 where there is moderate  right foraminal stenosis. No canal stenosis at any level. 2. Mild reactive subchondral marrow edema associated with the left C3-4 facet joint, which can be a focal source of pain.     Electronically Signed   By: Mabel Converse D.O.   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  No acute fracture otherwise advanced arthropathy noted.  Past Medical/Family/Surgical/Social History: Medications & Allergies reviewed per EMR, new medications updated. Patient Active Problem List   Diagnosis Date Noted   Hyperlipidemia 10/08/2022   OSA on CPAP 06/10/2022   Class 2 obesity due to excess calories without serious comorbidity with body mass index (BMI) of 38.0 to 38.9 in adult 05/07/2022   Nasal sinus congestion 05/07/2022   Loud snoring 05/07/2022   Hematoma of right hip 05/31/2020   S/P laparoscopic appendectomy 01/05/2013   Acute appendicitis with generalized peritonitis 01/04/2013   Past Medical History:  Diagnosis Date   Anemia    after hip surgery   Anxiety    Degenerated intervertebral disc    Depression    GERD (gastroesophageal reflux disease)    Hyperlipidemia    PONV (postoperative nausea and vomiting)    Seasonal allergies    History reviewed. No pertinent family history. Past Surgical History:  Procedure Laterality Date   ABDOMINAL HYSTERECTOMY     INCISION AND DRAINAGE Right 05/31/2020   Procedure: INCISION AND DRAINAGE RIGHT HIP HEMATOMA;  Surgeon: Melodi Lerner, MD;  Location: [facility];  Service: Orthopedics;  Laterality: Right;    LAPAROSCOPIC APPENDECTOMY N/A 01/04/2013   Procedure: APPENDECTOMY LAPAROSCOPIC;  Surgeon: Alm VEAR Angle, MD;  Location: [facility];  Service: General;  Laterality: N/A;   ROTATOR CUFF REPAIR Right    SHOULDER SURGERY Right 1990   TOTAL HIP ARTHROPLASTY Right    Social History   Occupational History   Not  on file  Tobacco Use   Smoking status: Never   Smokeless tobacco: Never  Vaping Use   Vaping status: Never Used  Substance and Sexual Activity   Alcohol use: Yes    Alcohol/week: 3.0 standard drinks of alcohol    Types: 3 Glasses of wine per week   Drug use: No   Sexual activity: Not on file

## 2024-06-08 ENCOUNTER — Ambulatory Visit (INDEPENDENT_AMBULATORY_CARE_PROVIDER_SITE_OTHER): Admitting: Neurology

## 2024-06-08 DIAGNOSIS — G5603 Carpal tunnel syndrome, bilateral upper limbs: Secondary | ICD-10-CM

## 2024-06-08 DIAGNOSIS — R202 Paresthesia of skin: Secondary | ICD-10-CM

## 2024-06-08 NOTE — Procedures (Signed)
 [facility]  [address] [place], [address]  [place], [place] [address] Tel: [phone] Fax: [phone] Test Date:  06/08/2024  Patient: [name] DOB: [birth year] Physician: Venetia Potters, MD  Sex: Female Height: 5' 0 Ref Phys: Oneil Neth, MD  ID#: [id number]   Technician:    History: This is a 53 year old female with neck and left arm pain.  NCV & EMG Findings: Extensive electrodiagnostic evaluation of the left upper limb with additional nerve conduction studies of the right upper limb shows: Left median sensory response is absent. Right median-ulnar palmar sensory response shows abnormal peak latency difference ((Median Palm-Wrist)-(Ulnar Palm-Wrist), 0.45 ms). Right median, left ulnar, and left radial sensory responses are within normal limits. Left median (APB) motor response shows prolonged distal onset latency (4.8 ms) and reduced amplitude (1.06 mV). Right median (APB) and left ulnar (ADM) motor responses are within normal limits. Chronic motor axon loss changes without accompanying active denervation changes are seen in the left abductor pollicis brevis muscle. All other tested muscles are within normal limits with normal motor unit configurations and recruitment patterns.  Neuromuscular Ultrasound Findings: High frequency (4.0-16.0 MHz) B-mode, nonvascular ultrasound of the left upper limb shows: Cross sectional areas (CSA) of left median (wrist to forearm) nerve is within normal limits. Wrist to forearm ratio of the left median nerve is increased (1.69). No other obvious lesion involving the adjacent bone or tendon is identified. No definite vascular abnormalities.  Impression: This is an abnormal study. The findings are most consistent with the following: Evidence of bilateral median mononeuropathy at or distal to the wrist, consistent with carpal tunnel syndrome. The findings are severe in degree electrically on the left and very mild in degree  electrically on the right. No electrodiagnostic evidence of a left cervical (C5-T1) motor radiculopathy.   ___________________________ Venetia Potters, MD    NCS+ Motor Nerve Results    Latency Amplitude F-Lat Segment Distance CV Comment  Site (ms) Norm (mV) Norm (ms)  (cm) (m/s) Norm   Left Median (APB) Motor  Wrist *4.8  < 4.0 *1.06  > 6.0        Elbow 8.1 - 0.76 -  Elbow-Wrist 22 67  > 50   Right Median (APB) Motor  Wrist 2.7  < 4.0 7.4  > 6.0        Left Ulnar (ADM) Motor  Wrist 1.63  < 3.1 12.1  > 7.0        Bel elbow 4.9 - 12.0 -  Bel elbow-Wrist 19.5 59  > 50   Ab elbow 6.8 - 11.6 -  Ab elbow-Bel elbow 10 53 -    Sensory Sites    Neg Peak Lat Amplitude (O-P) Segment Distance Velocity Comment  Site (ms) Norm (V) Norm  (cm) (ms)   Left Median Sensory  Wrist-Dig II *NR  < 3.6 *NR  > 15 Wrist-Dig II 13    Right Median Sensory  Wrist-Dig II 3.3  < 3.6 32  > 15 Wrist-Dig II 13    Right Median-Ulnar Palmar Sensory       Median  Palm-Wrist 2.2  < 2.2 69  > 10 Palm-Wrist 8         Ulnar  Palm-Wrist 1.75  < 2.2 29  > 5 Palm-Wrist 8    Left Radial Sensory  Forearm-Wrist 1.85  < 2.7 25  > 14 Forearm-Wrist 10    Left Ulnar Sensory  Wrist-Dig V 2.9  < 3.1 23  > 10  Wrist-Dig V 11     Inter-Nerve Comparisons   Nerve 1 Value 1 Nerve 2 Value 2 Parameter Result Normal  Sensory Sites  R Median Palm-Wrist 2.2 ms R Ulnar Palm-Wrist 1.75 ms Peak Lat Diff *0.45 ms <0.40   EMG+   Side Muscle Ins.Act Fibs Fasc Recrt Amp Dur Poly Activation Comment  Left FDI Nml Nml Nml Nml Nml Nml Nml Nml N/A  Left EIP Nml Nml Nml Nml Nml Nml Nml Nml N/A  Left Pronator teres Nml Nml Nml Nml Nml Nml Nml Nml N/A  Left APB Nml Nml Nml *2- *1+ *1+ *2+ Nml *ATR  Left Biceps Nml Nml Nml Nml Nml Nml Nml Nml N/A  Left Triceps Nml Nml Nml Nml Nml Nml Nml Nml N/A  Left Deltoid Nml Nml Nml Nml Nml Nml Nml Nml N/A   Nerve Measurements   Site Area Area2 Segment Area Ratio Mobility Vascularity Comment   mm  Norm mm Norm   Norm     Left Median  Wrist 8.8  < 13.0  8.1  < 13.0         Forearm 5.2  < 10.7    Wrist - Forearm *1.69  < 1.50          Waveforms:  Motor        Sensory

## 2024-06-15 ENCOUNTER — Ambulatory Visit: Admitting: Orthopedic Surgery

## 2024-06-15 DIAGNOSIS — G5602 Carpal tunnel syndrome, left upper limb: Secondary | ICD-10-CM | POA: Diagnosis not present

## 2024-06-15 NOTE — Progress Notes (Signed)
 [name] - 53 y.o. female MRN [id number]  Date of birth: [birth year]  Office Visit Note: Visit Date: 06/15/2024 PCP: Shayne Anes, MD Referred by: Shayne Anes, MD  Subjective: No chief complaint on file.  HPI: [name] is a pleasant 53 y.o. female who presents today for left hand numbness/tingling.  She has been seen in the past by Dr. Burnetta, underwent cortisone injection to the left carpal tunnel for diagnostic and therapeutic measures.  Did experience transient relief of her symptoms from the carpal tunnel injection.  Is also undergone prior EMG testing of left upper extremity which did confirm severe carpal tunnel syndrome.  Of note, she does have polymyalgia rheumatica and is currently on daily steroid.  She is currently on a taper, is taking 7 mg daily at this juncture, with tapering of 1 mg every 3 to 4 weeks per her rheumatologist.  She is here today as a referral for specific hand surgical evaluation.  Pertinent ROS were reviewed with the patient and found to be negative unless otherwise specified above in HPI.   Visit Reason: left carpal tunnel syndrome Duration of symptoms: 6 months Hand dominance: right Occupation: Nurse Practitioner Diabetic: No Smoking: No Heart/Lung History: none Blood Thinners: none  Prior Testing/EMG: EMG 06/08/24 Injections (Date): 05/12/24 Treatments: injection, brace Prior Surgery: none    Assessment & Plan: Visit Diagnoses:  1. Carpal tunnel syndrome, left upper limb     Plan: Extensive discussion was had with the patient today about her ongoing left sided carpal tunnel syndrome that is refractory to conservative care.  Patient has both clinical and electrodiagnostic evidence to confirm this diagnosis.  At this juncture, she is indicated for left open carpal tunnel release with possible tenosynovectomy.  I would like her to be down to 5 mg daily of steroid preoperatively in order to minimize complication risk.  This was  discussed at length with her today.  She will get in touch with her rheumatologist and will let me know moving forward when her taper dose is appropriate to move forward with surgical scheduling.  Risks include but not limited to infection, bleeding, scarring, stiffness, nerve injury or vascular, tendon injury, risk of recurrence and need for subsequent operation were all discussed in detail.  Patient consented understanding the above.  Will move forward surgical scheduling when appropriate.   Follow-up: No follow-ups on file.   Meds & Orders: No orders of the defined types were placed in this encounter.  No orders of the defined types were placed in this encounter.    Procedures: No procedures performed      Clinical History: No specialty comments available.  She reports that she has never smoked. She has never used smokeless tobacco. No results for input(s): HGBA1C, LABURIC in the last 8760 hours.  Objective:   Vital Signs: There were no vitals taken for this visit.  Physical Exam  Gen: Well-appearing, in no acute distress; non-toxic CV: Regular Rate. Well-perfused. Warm.  Resp: Breathing unlabored on room air; no wheezing. Psych: Fluid speech in conversation; appropriate affect; normal thought process  Ortho Exam PHYSICAL EXAM:  General: Patient is well appearing and in no distress.   Skin and Muscle: No significant skin changes are apparent to upper extremities.   Range of Motion and Palpation Tests: Mobility is full about the elbows with flexion and extension. Forearm supination and pronation are 85/85 bilaterally.  Wrist flexion/extension is 75/65 bilaterally.  Digital flexion and extension are full.  Thumb opposition is  full to the base of the small fingers bilaterally.    No cords or nodules are palpated.  No triggering is observed.     Neurologic, Vascular, Motor: Sensation is diminished to light touch in the left median nerve distribution.    Thenar  atrophy: Positive left Tinel sign: Positive left carpal tunnel Carpal tunnel compression: Positive left Phalen test: Positive left   Motor left hand FPL: 5/5 Index FDP: 5/5 APB: 4/5  Fingers pink and well perfused.  Capillary refill is brisk.     No results found for: HGBA1C    Imaging: No results found.  Past Medical/Family/Surgical/Social History: Medications & Allergies reviewed per EMR, new medications updated. Patient Active Problem List   Diagnosis Date Noted   Hyperlipidemia 10/08/2022   OSA on CPAP 06/10/2022   Class 2 obesity due to excess calories without serious comorbidity with body mass index (BMI) of 38.0 to 38.9 in adult 05/07/2022   Nasal sinus congestion 05/07/2022   Loud snoring 05/07/2022   Hematoma of right hip 05/31/2020   S/P laparoscopic appendectomy 01/05/2013   Acute appendicitis with generalized peritonitis 01/04/2013   Past Medical History:  Diagnosis Date   Anemia    after hip surgery   Anxiety    Degenerated intervertebral disc    Depression    GERD (gastroesophageal reflux disease)    Hyperlipidemia    PONV (postoperative nausea and vomiting)    Seasonal allergies    No family history on file. Past Surgical History:  Procedure Laterality Date   ABDOMINAL HYSTERECTOMY     INCISION AND DRAINAGE Right 05/31/2020   Procedure: INCISION AND DRAINAGE RIGHT HIP HEMATOMA;  Surgeon: Melodi Lerner, MD;  Location: [facility];  Service: Orthopedics;  Laterality: Right;    LAPAROSCOPIC APPENDECTOMY N/A 01/04/2013   Procedure: APPENDECTOMY LAPAROSCOPIC;  Surgeon: Alm VEAR Angle, MD;  Location: [facility];  Service: General;  Laterality: N/A;   ROTATOR CUFF REPAIR Right    SHOULDER SURGERY Right 1990   TOTAL HIP ARTHROPLASTY Right    Social History   Occupational History   Not on file  Tobacco Use   Smoking status: Never   Smokeless tobacco: Never  Vaping Use   Vaping status: Never Used  Substance and Sexual Activity   Alcohol use: Yes     Alcohol/week: 3.0 standard drinks of alcohol    Types: 3 Glasses of wine per week   Drug use: No   Sexual activity: Not on file    Carnell Casamento Estela) Arlinda, M.D. [facility], Hand Surgery

## 2024-06-21 ENCOUNTER — Encounter: Payer: Self-pay | Admitting: Radiology

## 2024-06-23 ENCOUNTER — Encounter: Payer: Self-pay | Admitting: Orthopedic Surgery

## 2024-06-23 ENCOUNTER — Telehealth: Payer: Self-pay | Admitting: Orthopedic Surgery

## 2024-06-23 NOTE — Telephone Encounter (Signed)
 I spoke with the patient in regards to scheduling surgery. Patient would like to hold off until possibly the second week of January to schedule surgery. She will be almost completely off her steroid then. (She is still on 7 mg now, and needs to be down to 5 mg at least.) We discussed a return call in December to schedule her for January. Patient agreed.

## 2024-07-06 ENCOUNTER — Ambulatory Visit: Admitting: Sports Medicine

## 2024-07-19 ENCOUNTER — Encounter: Payer: Self-pay | Admitting: Orthopedic Surgery

## 2024-07-22 ENCOUNTER — Encounter: Payer: Self-pay | Admitting: Orthopedic Surgery

## 2024-07-29 ENCOUNTER — Ambulatory Visit: Admitting: Rehabilitative and Restorative Service Providers"

## 2024-07-29 ENCOUNTER — Encounter: Payer: Self-pay | Admitting: Rehabilitative and Restorative Service Providers"

## 2024-07-29 DIAGNOSIS — M79642 Pain in left hand: Secondary | ICD-10-CM

## 2024-07-29 DIAGNOSIS — M6281 Muscle weakness (generalized): Secondary | ICD-10-CM

## 2024-07-29 DIAGNOSIS — M79641 Pain in right hand: Secondary | ICD-10-CM

## 2024-07-29 DIAGNOSIS — M25632 Stiffness of left wrist, not elsewhere classified: Secondary | ICD-10-CM

## 2024-07-29 DIAGNOSIS — R202 Paresthesia of skin: Secondary | ICD-10-CM

## 2024-07-29 NOTE — Therapy (Signed)
 OUTPATIENT OCCUPATIONAL THERAPY ORTHO EVALUATION  Patient Name: [name] MRN: [id number] DOB:04/20/1971, 53 y.o., female Today's Date: 07/29/2024  PCP: Shayne BATTLE MD REFERRING PROVIDER:  Sammi Laymon Braver, NP    END OF SESSION:  OT End of Session - 07/29/24 1153     Visit Number 1    Number of Visits 9    Date for Recertification  09/24/24    Authorization Type BC[employer]    OT Start Time 1154    OT Stop Time 1312    OT Time Calculation (min) 78 min    Activity Tolerance Patient tolerated treatment well;No increased pain;Patient limited by pain    Behavior During Therapy [facility] for tasks assessed/performed          Past Medical History:  Diagnosis Date   Anemia    after hip surgery   Anxiety    Degenerated intervertebral disc    Depression    GERD (gastroesophageal reflux disease)    Hyperlipidemia    PONV (postoperative nausea and vomiting)    Seasonal allergies    Past Surgical History:  Procedure Laterality Date   ABDOMINAL HYSTERECTOMY     INCISION AND DRAINAGE Right 05/31/2020   Procedure: INCISION AND DRAINAGE RIGHT HIP HEMATOMA;  Surgeon: Melodi Lerner, MD;  Location: [facility];  Service: Orthopedics;  Laterality: Right;    LAPAROSCOPIC APPENDECTOMY N/A 01/04/2013   Procedure: APPENDECTOMY LAPAROSCOPIC;  Surgeon: Alm VEAR Angle, MD;  Location: [facility];  Service: General;  Laterality: N/A;   ROTATOR CUFF REPAIR Right    SHOULDER SURGERY Right 1990   TOTAL HIP ARTHROPLASTY Right    Patient Active Problem List   Diagnosis Date Noted   Hyperlipidemia 10/08/2022   OSA on CPAP 06/10/2022   Class 2 obesity due to excess calories without serious comorbidity with body mass index (BMI) of 38.0 to 38.9 in adult 05/07/2022   Nasal sinus congestion 05/07/2022   Loud snoring 05/07/2022   Hematoma of right hip 05/31/2020   S/P laparoscopic appendectomy 01/05/2013   Acute appendicitis with generalized peritonitis 01/04/2013    ONSET DATE: Approx 6  month onset of pain, stiffness  REFERRING DIAG: M79.641, Rt hand pain   THERAPY DIAG:  Pain in left hand - Plan: Ot plan of care cert/re-cert  Pain in right hand - Plan: Ot plan of care cert/re-cert  Stiffness of left wrist, not elsewhere classified - Plan: Ot plan of care cert/re-cert  Muscle weakness (generalized) - Plan: Ot plan of care cert/re-cert  Paresthesia of skin - Plan: Ot plan of care cert/re-cert  Rationale for Evaluation and Treatment: Rehabilitation  SUBJECTIVE:   SUBJECTIVE STATEMENT: She state she had an EMG recently, and has had exacerbation of pain in both hands L > R. She was on steroid, and rheumatology was concerned for PMR.  She has CT injection scheduled, but wanted to come in earlier as she has been suffering in pain. She states the joints of both hands hurt, numbness in digits 1-3, stiffness in both hands, pain through the dorsum of the wrist down the forearm bilaterally as well.  All symptoms are worse in the left hand and arm.   PERTINENT HISTORY: Has been seeing rheumatology, has had carpal tunnel injections in the past, has had EMG showing severe carpal tunnel on the left side.  Has tried physical therapy in the past to help her hand and arm symptoms with suboptimal results  PRECAUTIONS: None  RED FLAGS: None   WEIGHT BEARING RESTRICTIONS: No  PAIN:  Are you having pain? Yes: NPRS scale: At worst in the past week between 6-7/10 pain Pain location: Bilateral dorsum of wrists, bilateral IP joints of hands Pain description: Sometimes sharp and shocking, sometimes tight and pulling Aggravating factors: Gripping squeezing and repetition also sleep Relieving factors: Unsure  FALLS: Has patient fallen in last 6 months? No  PLOF: Independent  PATIENT GOALS: To safely improve her pain and ability with both hands and arms for better quality of life  NEXT MD VISIT: She is seeing multiple providers in the near future, including carpal tunnel injection in  approximately 1 to 2 weeks.   OBJECTIVE: (All objective assessments below are from initial evaluation on: 07/29/24 unless otherwise specified.)   HAND DOMINANCE: Right   ADLs: Overall ADLs: States decreased ability to grab, hold household objects, pain and difficulty to open containers, perform FMS tasks (manipulate fasteners on clothing), etc.   FUNCTIONAL OUTCOME MEASURES: Eval: Patient Specific Functional Scale:  (TBD next session as time allows)  (Higher Score  =  Better Ability for the Selected Tasks)      UPPER EXTREMITY ROM     Shoulder to Wrist AROM Right eval Left eval  Shoulder flexion ~150 ~150  Shoulder abduction    Shoulder extension ~65 not symptomatic ~65 slightly symptomatic   Shoulder internal rotation 43 not symptomatic 40 not symptomatic   Shoulder external rotation 76 80  Elbow flexion    Elbow extension    Forearm supination 66 64  Forearm pronation  90 90  Wrist flexion 56 51  Wrist extension 61 53  Wrist ulnar deviation    Wrist radial deviation    Functional dart thrower's motion (F-DTM) in ulnar flexion    F-DTM in radial extension     (Blank rows = not tested)   Hand AROM Right eval Left eval  Full Fist Ability (or Gap to Distal Palmar Crease) Full fist, but not quite to [facility] 3.9cm gap from tip of MF to [facility], cannot fully straighten hand/fingers either  Thumb Opposition  (Kapandji Scale)  8/10 5/10  Thumb MCP (0-60)    Thumb IP (0-80)    Thumb Radial Abduction Span     Thumb Palmar Abduction Span     Index MCP (0-90)     Index PIP (0-100)     Index DIP (0-70)      Long MCP (0-90)      Long PIP (0-100)      Long DIP (0-70)      Ring MCP (0-90)      Ring PIP (0-100)      Ring DIP (0-70)      Little MCP (0-90)      Little PIP (0-100)      Little DIP (0-70)      (Blank rows = not tested)   UPPER EXTREMITY MMT:     MMT Right 07/29/24 Left 07/29/24  Shoulder flexion    Shoulder abduction    Shoulder adduction    Shoulder  extension    Shoulder internal rotation    Shoulder external rotation    Middle trapezius    Lower trapezius    Elbow flexion    Elbow extension    Forearm supination 4+/5 4+/5  Forearm pronation 4-/5 4-/5  Wrist flexion 4/5 felt in ECU 4/5 felt in ECU  Wrist extension 4-/5 tender 4-/5 tender  Wrist ulnar deviation    Wrist radial deviation    (Blank rows = not tested)  HAND FUNCTION:  Eval: Observed weakness in affected bil hands.  Left greater than right Grip strength Right: 31 lbs, Left: 18 lbs   COORDINATION: Eval: Observed coordination impairments with affected left hand as seen by inability to make a full fist.  Details are TBD as needed  SENSATION: Eval: She has light touch, but states it is diminished in the first 3 digits of both hands, and the fourth digit of the right hand.  She also describes sharp shocking pains when her fingers are tapped or bumped or pushed at times.  She has no complaints in the ulnar nerve distribution today, and no significant radial nerve complaints either, but a tight tricep does shoot some radiating pain down her left arm when palpated  EDEMA:   Eval:  Mildly swollen in bilateral, left greater than right hands today  COGNITION: Eval: Overall cognitive status: WFL for evaluation today   OBSERVATIONS:   Eval:  bil ECU tenderness at insertion and resisted wrist ext; tightness palpated through long biceps tendon, somewhat in triceps; negative Tinel's test at the cubital tunnel and radial tunnels-positive Tinel's test at carpal tunnel and pronator areas.  Apparent intrinsic tightness of the left hand with stiffness.  Today she presents as bilateral ECU tendinitis, hand arthritis and stiffness bilaterally, carpal tunnel syndrome bilaterally, also generalized stiffness through the shoulders, forearms, biceps and triceps.  TODAY'S TREATMENT:  Post-evaluation treatment:   To help her manage her numerous, bilateral symptoms, OT gives her a robust home  exercise program today including nerve gliding's for neuromuscular reeducation, passive stretches to help remediate tightness and stiffness in nerve impingement.  Each 1 was explained to her and demonstrated with her thoroughly, and she states understanding.  She knows that they should be relatively nonpainful, and she should stop doing any of them if they are causing significant pain and report this back to the therapist.  OT also spent a fair bit of time discussing management of upper body paresthesia symptoms, as well as arthritis, avoiding repetitive stresses, avoiding vibrations, avoiding direct pressure on nerve sites, etc.  She states understanding these directions.   Exercises - Median Nerve Flossing  - 2-3 x daily - 5-10 reps - Seated Median Nerve Glide  - 3-4 x daily - 5 reps - Fridge Door Stretch  - 4 x daily - 3-5 reps - 15 sec hold - Doorway Pec Stretch at 90 Degrees Abduction  - 4-6 x daily - 1 sets - 10-15 reps - Triceps Stretch- Do on back of chair   - 3-4 x daily - 3-5 reps - 15 hold - Forearm Supination Stretch  - 3-4 x daily - 3-5 reps - 15 sec hold - Wrist Flexion Stretch  - 4 x daily - 3-5 reps - 10-15 sec hold - Wrist Prayer Stretch  - 4 x daily - 3-5 reps - 15 sec hold - BACK KNUCKLE STRETCHES   - 4 x daily - 3-5 reps - 15 sec hold - HOOK Stretch  - 4 x daily - 3-5 reps - 15-20 sec hold - Seated Finger Composite Flexion Stretch  - 4 x daily - 3-5 reps - 15 hold - Stretch thumb toward base of small finger (put hand in LAP)  - 2-3 x daily - 3-5 reps - 15 sec hold - PUSH KNUCKLES DOWN  - 4 x daily - 3-5 reps - 15 seconds hold  PATIENT EDUCATION: Education details: See tx section above for details  Person educated: Patient Education method: Verbal Instruction, Teach back, Handouts  Education comprehension: States and demonstrates understanding, Additional Education required    HOME EXERCISE PROGRAM: Access Code: [email or web address] URL: [email or web address] Date:  07/29/2024 Prepared by: Melvenia Ada   GOALS: Goals reviewed with patient? Yes   SHORT TERM GOALS: (STG required if POC>30 days) Target Date: 08/20/24  Pt will obtain protective, custom orthotic. Goal status: TBD/PRN  2.  Pt will demo/state understanding of initial HEP to improve pain levels and prerequisite motion. Goal status: INITIAL   LONG TERM GOALS: Target Date: 09/24/24  Pt will improve functional ability by decreased impairment per PSFS assessment from TBD to TBD or better, for better quality of life. Goal status: INITIAL  2.  Pt will improve grip strength in Lt hand from 18lbs to at least 30lbs for functional use at home and in IADLs. Goal status: INITIAL  3.  Pt will improve A/ROM in Lt wrist flex/ext from 51/53 degrees respectively, to at least 62 degrees each, to have functional motion for tasks like reach and grasp.  Goal status: INITIAL  4.  Pt will improve strength in bil resisted wrist extension from tender 4-/5 MMT to at least 4+/5 MMT to have increased functional ability to carry out selfcare and higher-level homecare tasks with less difficulty. Goal status: INITIAL  5.  Pt will improve coordination skills in Lt hand, as seen by within functional limit score on 9HPT testing to have increased functional ability to carry out fine motor tasks (fasteners, etc.) and more complex, coordinated IADLs (meal prep, sports, etc.).  Goal status: INITIAL  6.  Pt will decrease pain at worst to 2/10 or better to have better sleep and occupational participation in daily roles. Goal status: INITIAL   ASSESSMENT:  CLINICAL IMPRESSION: Patient is a 53 y.o. female who was seen today for occupational therapy evaluation for stiffness in her hands, pain in her hands, pain through the wrist and forearm in a bilateral fashion-these things decrease her functional ability with home and work tasks.  These things are thought to be ECU tendinitis bilaterally, carpal tunnel syndrome  bilaterally, hand arthritis and stiffness bilaterally, left worse than the right for all.  The patient will benefit from outpatient occupational therapy to decrease symptoms, improve functional upper extremity use, and increase quality of life.  PERFORMANCE DEFICITS: in functional skills including ADLs, IADLs, coordination, sensation, edema, pain, Fine motor control, body mechanics, endurance, and UE functional use, cognitive skills including problem solving and safety awareness, and psychosocial skills including coping strategies, environmental adaptation, habits, and routines and behaviors.   IMPAIRMENTS: are limiting patient from ADLs, IADLs, rest and sleep, and leisure.   COMORBIDITIES: has co-morbidities such as GERD, depression, anxiety, DDD, past hip and shoulder surgeries, and possibly others that affects occupational performance. Patient will benefit from skilled OT to address above impairments and improve overall function.  MODIFICATION OR ASSISTANCE TO COMPLETE EVALUATION: Min-Moderate modification of tasks or assist with assess necessary to complete an evaluation.  OT OCCUPATIONAL PROFILE AND HISTORY: Detailed assessment: Review of records and additional review of physical, cognitive, psychosocial history related to current functional performance.  CLINICAL DECISION MAKING: Moderate - several treatment options, min-mod task modification necessary  REHAB POTENTIAL: Good  EVALUATION COMPLEXITY: Moderate      PLAN:  OT FREQUENCY: 1x/week  OT DURATION: 8 weeks through 09/24/24 and up to 9 total visits as needed   PLANNED INTERVENTIONS: 97535 self care/ADL training, 02889 therapeutic exercise, 97530 therapeutic activity, 97112 neuromuscular re-education, 97140 manual therapy, 97035 ultrasound, 97032 electrical stimulation (manual), [id number] Orthotic Initial,  02236 Orthotic/Prosthetic subsequent, compression bandaging, Dry needling, energy conservation, coping strategies training, and  patient/family education  RECOMMENDED OTHER SERVICES: none now    CONSULTED AND AGREED WITH PLAN OF CARE: Patient  PLAN FOR NEXT SESSION:   Review initial HEP and recommendations    Melvenia Ada, OTR/L, CHT  07/29/2024, 1:40 PM

## 2024-08-03 ENCOUNTER — Ambulatory Visit: Admitting: Orthopedic Surgery

## 2024-08-03 DIAGNOSIS — G5603 Carpal tunnel syndrome, bilateral upper limbs: Secondary | ICD-10-CM

## 2024-08-03 DIAGNOSIS — G5602 Carpal tunnel syndrome, left upper limb: Secondary | ICD-10-CM

## 2024-08-03 DIAGNOSIS — G5601 Carpal tunnel syndrome, right upper limb: Secondary | ICD-10-CM

## 2024-08-03 MED ORDER — LIDOCAINE HCL 1 % IJ SOLN
1.0000 mL | INTRAMUSCULAR | Status: AC | PRN
  Administered 2024-08-03: 10:00:00 1 mL

## 2024-08-03 MED ORDER — BETAMETHASONE SOD PHOS & ACET 6 (3-3) MG/ML IJ SUSP
6.0000 mg | INTRAMUSCULAR | Status: AC | PRN
  Administered 2024-08-03: 10:00:00 6 mg via INTRA_ARTICULAR

## 2024-08-03 NOTE — Telephone Encounter (Signed)
 I spoke with the patient on 07/29/24 and scheduled her surgery for 09/02/24.

## 2024-08-03 NOTE — Progress Notes (Signed)
 [name] - 53 y.o. female MRN [id number]  Date of birth: 11/30/1970  Office Visit Note: Visit Date: 08/03/2024 PCP: Shayne Anes, MD Referred by: Shayne Anes, MD  Subjective: No chief complaint on file.  HPI: [name] is a pleasant 53 y.o. female who returns today for ongoing right hand numbness and tingling.  She was previously seen for left hand numbness/tingling.  She has been seen in the past by Dr. Burnetta, underwent cortisone injection to the left carpal tunnel for diagnostic and therapeutic measures.  Did experience transient relief of her symptoms from the carpal tunnel injection.  Is also undergone prior EMG testing of bilateral upper extremity which did confirm severe carpal tunnel syndrome on the left, mild on the right.  Of note, she does have polymyalgia rheumatica.  She is currently off steroids per recommendations for her upcoming left open carpal tunnel release surgery next month.   Pertinent ROS were reviewed with the patient and found to be negative unless otherwise specified above in HPI.      Assessment & Plan: Visit Diagnoses:  1. Carpal tunnel syndrome, right upper limb   2. Carpal tunnel syndrome, left upper limb      Plan: Extensive discussion was had with the patient today about her ongoing right sided symptoms.  This does appear to be consistent with ongoing right sided carpal tunnel syndrome.  She does have both clinical and electrodiagnostic evidence to confirm this diagnosis.  EMG findings were more severe on the left side.  For the left sided carpal tunnel syndrome that is refractory to conservative care, we will still proceed with the left open carpal tunnel release next month.    As for the right side extensive discussion was had with the patient today about their ongoing right sided carpal tunnel syndrome.  We discussed the underlying etiology and pathophysiology of carpal tunnel syndrome.  We discussed treatment modalities ranging  from conservative to surgical.  From a conservative standpoint, we discussed ongoing activity modification, bracing, nonsteroidal anti-inflammatory medication, therapeutic exercise and stretching and possible cortisone injections.  We discussed anti-inflammatory medication both topical and oral.  We did also discuss the possibility of carpal tunnel release both open and endoscopic procedures, as well as appropriate postoperative regimen should symptoms remain refractory to conservative care.   For the time being, she would like to proceed with cortisone injection to the right carpal tunnel region for both diagnostic and therapeutic measures which is appropriate.  Injection was performed today and tolerated well.   Follow-up: No follow-ups on file.   Meds & Orders: No orders of the defined types were placed in this encounter.  No orders of the defined types were placed in this encounter.    Procedures: Hand/UE Inj: R carpal tunnel for carpal tunnel syndrome on 08/03/2024 9:34 AM Indications: diagnostic and therapeutic Details: 25 G needle Medications: 1 mL lidocaine  1 %; 6 mg betamethasone  acetate-betamethasone  sodium phosphate  6 (3-3) MG/ML Outcome: tolerated well, no immediate complications Procedure, treatment alternatives, risks and benefits explained, specific risks discussed.          Clinical History: No specialty comments available.  She reports that she has never smoked. She has never used smokeless tobacco. No results for input(s): HGBA1C, LABURIC in the last 8760 hours.  Objective:   Vital Signs: There were no vitals taken for this visit.  Physical Exam  Gen: Well-appearing, in no acute distress; non-toxic CV: Regular Rate. Well-perfused. Warm.  Resp: Breathing unlabored on room air;  no wheezing. Psych: Fluid speech in conversation; appropriate affect; normal thought process  Ortho Exam PHYSICAL EXAM:  General: Patient is well appearing and in no distress.    Skin and Muscle: No significant skin changes are apparent to upper extremities.   Range of Motion and Palpation Tests: Mobility is full about the elbows with flexion and extension. Forearm supination and pronation are 85/85 bilaterally.  Wrist flexion/extension is 75/65 bilaterally.  Digital flexion and extension are full.  Thumb opposition is full to the base of the small fingers bilaterally.    No cords or nodules are palpated.  No triggering is observed.     Neurologic, Vascular, Motor: Sensation is diminished to light touch in the bilateral median nerve distribution.    Thenar atrophy: Positive left Tinel sign: Positive left carpal tunnel Carpal tunnel compression: Positive left Phalen test: Positive left   Motor bilateral hand FPL: 5/5 Index FDP: 5/5 APB: 4/5 left, 5/5 right  Fingers pink and well perfused.  Capillary refill is brisk.     No results found for: HGBA1C    Imaging: No results found.  Past Medical/Family/Surgical/Social History: Medications & Allergies reviewed per EMR, new medications updated. Patient Active Problem List   Diagnosis Date Noted   Hyperlipidemia 10/08/2022   OSA on CPAP 06/10/2022   Class 2 obesity due to excess calories without serious comorbidity with body mass index (BMI) of 38.0 to 38.9 in adult 05/07/2022   Nasal sinus congestion 05/07/2022   Loud snoring 05/07/2022   Hematoma of right hip 05/31/2020   S/P laparoscopic appendectomy 01/05/2013   Acute appendicitis with generalized peritonitis 01/04/2013   Past Medical History:  Diagnosis Date   Anemia    after hip surgery   Anxiety    Degenerated intervertebral disc    Depression    GERD (gastroesophageal reflux disease)    Hyperlipidemia    PONV (postoperative nausea and vomiting)    Seasonal allergies    No family history on file. Past Surgical History:  Procedure Laterality Date   ABDOMINAL HYSTERECTOMY     INCISION AND DRAINAGE Right 05/31/2020   Procedure:  INCISION AND DRAINAGE RIGHT HIP HEMATOMA;  Surgeon: Melodi Lerner, MD;  Location: [facility];  Service: Orthopedics;  Laterality: Right;    LAPAROSCOPIC APPENDECTOMY N/A 01/04/2013   Procedure: APPENDECTOMY LAPAROSCOPIC;  Surgeon: Alm VEAR Angle, MD;  Location: [facility];  Service: General;  Laterality: N/A;   ROTATOR CUFF REPAIR Right    SHOULDER SURGERY Right 1990   TOTAL HIP ARTHROPLASTY Right    Social History   Occupational History   Not on file  Tobacco Use   Smoking status: Never   Smokeless tobacco: Never  Vaping Use   Vaping status: Never Used  Substance and Sexual Activity   Alcohol use: Yes    Alcohol/week: 3.0 standard drinks of alcohol    Types: 3 Glasses of wine per week   Drug use: No   Sexual activity: Not on file    Agueda Houpt Estela) Arlinda, M.D. [facility], Hand Surgery

## 2024-08-05 ENCOUNTER — Other Ambulatory Visit: Payer: Self-pay

## 2024-08-05 DIAGNOSIS — G5602 Carpal tunnel syndrome, left upper limb: Secondary | ICD-10-CM

## 2024-08-23 ENCOUNTER — Encounter: Payer: Self-pay | Admitting: Orthopedic Surgery

## 2024-08-26 ENCOUNTER — Encounter: Payer: Self-pay | Admitting: Orthopedic Surgery

## 2024-09-16 ENCOUNTER — Encounter: Admitting: Orthopedic Surgery

## 2024-09-16 ENCOUNTER — Encounter: Admitting: Rehabilitative and Restorative Service Providers"
